# Patient Record
Sex: Male | Born: 1945
Health system: Southern US, Community
[De-identification: ages and names within clinical notes are randomized; demographics above are authoritative.]

## PROBLEM LIST (undated history)

## (undated) DIAGNOSIS — N419 Inflammatory disease of prostate, unspecified: Secondary | ICD-10-CM

## (undated) DIAGNOSIS — IMO0001 Reserved for inherently not codable concepts without codable children: Secondary | ICD-10-CM

## (undated) DIAGNOSIS — H9190 Unspecified hearing loss, unspecified ear: Secondary | ICD-10-CM

## (undated) DIAGNOSIS — I471 Supraventricular tachycardia, unspecified: Secondary | ICD-10-CM

## (undated) DIAGNOSIS — D509 Iron deficiency anemia, unspecified: Secondary | ICD-10-CM

## (undated) DIAGNOSIS — J309 Allergic rhinitis, unspecified: Secondary | ICD-10-CM

## (undated) DIAGNOSIS — Z800 Family history of malignant neoplasm of digestive organs: Secondary | ICD-10-CM

## (undated) DIAGNOSIS — K625 Hemorrhage of anus and rectum: Secondary | ICD-10-CM

## (undated) DIAGNOSIS — Q5310 Unspecified undescended testicle, unilateral: Secondary | ICD-10-CM

## (undated) DIAGNOSIS — L570 Actinic keratosis: Secondary | ICD-10-CM

## (undated) DIAGNOSIS — Z8719 Personal history of other diseases of the digestive system: Secondary | ICD-10-CM

## (undated) DIAGNOSIS — E785 Hyperlipidemia, unspecified: Secondary | ICD-10-CM

## (undated) HISTORY — DX: Supraventricular tachycardia, unspecified: I47.10

## (undated) HISTORY — DX: Actinic keratosis: L57.0

## (undated) HISTORY — PX: INGUINAL HERNIA REPAIR: SHX194

## (undated) HISTORY — DX: Family history of malignant neoplasm of digestive organs: Z80.0

## (undated) HISTORY — DX: Personal history of other diseases of the digestive system: Z87.19

## (undated) HISTORY — DX: Reserved for inherently not codable concepts without codable children: IMO0001

## (undated) HISTORY — DX: Inflammatory disease of prostate, unspecified: N41.9

## (undated) HISTORY — DX: Hemorrhage of anus and rectum: K62.5

## (undated) HISTORY — DX: Allergic rhinitis, unspecified: J30.9

## (undated) HISTORY — PX: PROSTATE BIOPSY: SHX241

## (undated) HISTORY — DX: Iron deficiency anemia, unspecified: D50.9

## (undated) HISTORY — DX: Unspecified undescended testicle, unilateral: Q53.10

## (undated) HISTORY — DX: Unspecified hearing loss, unspecified ear: H91.90

## (undated) HISTORY — DX: Hyperlipidemia, unspecified: E78.5

## (undated) HISTORY — PX: COLONOSCOPY: SHX174

---

## 2006-10-11 ENCOUNTER — Ambulatory Visit: Payer: Self-pay | Admitting: Urology

## 2012-12-08 DIAGNOSIS — C4491 Basal cell carcinoma of skin, unspecified: Secondary | ICD-10-CM

## 2012-12-08 HISTORY — DX: Basal cell carcinoma of skin, unspecified: C44.91

## 2014-03-01 DIAGNOSIS — Z8719 Personal history of other diseases of the digestive system: Secondary | ICD-10-CM

## 2014-03-01 HISTORY — DX: Personal history of other diseases of the digestive system: Z87.19

## 2014-03-09 ENCOUNTER — Other Ambulatory Visit: Payer: Self-pay | Admitting: Family Medicine

## 2014-03-09 ENCOUNTER — Ambulatory Visit (INDEPENDENT_AMBULATORY_CARE_PROVIDER_SITE_OTHER): Payer: BC Managed Care – PPO | Admitting: Family Medicine

## 2014-03-09 ENCOUNTER — Encounter: Payer: Self-pay | Admitting: Family Medicine

## 2014-03-09 ENCOUNTER — Ambulatory Visit (HOSPITAL_BASED_OUTPATIENT_CLINIC_OR_DEPARTMENT_OTHER)
Admission: RE | Admit: 2014-03-09 | Discharge: 2014-03-09 | Disposition: A | Discharge status: Home / Self Care | Payer: Medicare Other | Source: Ambulatory Visit | Attending: Family Medicine | Admitting: Family Medicine

## 2014-03-09 VITALS — BP 133/85 | HR 73 | Temp 97.7°F | Ht 69.0 in | Wt 195.0 lb

## 2014-03-09 DIAGNOSIS — K4090 Unilateral inguinal hernia, without obstruction or gangrene, not specified as recurrent: Secondary | ICD-10-CM

## 2014-03-09 DIAGNOSIS — R634 Abnormal weight loss: Secondary | ICD-10-CM | POA: Insufficient documentation

## 2014-03-09 DIAGNOSIS — G8929 Other chronic pain: Secondary | ICD-10-CM | POA: Insufficient documentation

## 2014-03-09 DIAGNOSIS — R1032 Left lower quadrant pain: Secondary | ICD-10-CM

## 2014-03-09 DIAGNOSIS — R109 Unspecified abdominal pain: Secondary | ICD-10-CM | POA: Diagnosis present

## 2014-03-09 LAB — CBC WITH DIFFERENTIAL/PLATELET
BASOS PCT: 0.2 % (ref 0.0–3.0)
Basophils Absolute: 0 10*3/uL (ref 0.0–0.1)
Eosinophils Absolute: 0.2 10*3/uL (ref 0.0–0.7)
Eosinophils Relative: 1.8 % (ref 0.0–5.0)
HCT: 36.7 % — ABNORMAL LOW (ref 39.0–52.0)
HEMOGLOBIN: 11.9 g/dL — AB (ref 13.0–17.0)
LYMPHS PCT: 9.2 % — AB (ref 12.0–46.0)
Lymphs Abs: 1.1 10*3/uL (ref 0.7–4.0)
MCHC: 32.5 g/dL (ref 30.0–36.0)
MCV: 90.3 fl (ref 78.0–100.0)
MONOS PCT: 8.5 % (ref 3.0–12.0)
Monocytes Absolute: 1 10*3/uL (ref 0.1–1.0)
NEUTROS PCT: 80.3 % — AB (ref 43.0–77.0)
Neutro Abs: 9.6 10*3/uL — ABNORMAL HIGH (ref 1.4–7.7)
Platelets: 286 10*3/uL (ref 150.0–400.0)
RBC: 4.07 Mil/uL — AB (ref 4.22–5.81)
RDW: 14.3 % (ref 11.5–15.5)
WBC: 11.9 10*3/uL — AB (ref 4.0–10.5)

## 2014-03-09 LAB — COMPREHENSIVE METABOLIC PANEL
ALBUMIN: 3.2 g/dL — AB (ref 3.5–5.2)
ALT: 12 U/L (ref 0–53)
AST: 10 U/L (ref 0–37)
Alkaline Phosphatase: 61 U/L (ref 39–117)
BUN: 18 mg/dL (ref 6–23)
CALCIUM: 9.2 mg/dL (ref 8.4–10.5)
CHLORIDE: 104 meq/L (ref 96–112)
CO2: 26 mEq/L (ref 19–32)
Creatinine, Ser: 1.1 mg/dL (ref 0.4–1.5)
GFR: 68.31 mL/min (ref >60.00)
GLUCOSE: 93 mg/dL (ref 70–99)
POTASSIUM: 4 meq/L (ref 3.5–5.1)
Sodium: 136 mEq/L (ref 135–145)
Total Bilirubin: 0.5 mg/dL (ref 0.2–1.2)
Total Protein: 6.5 g/dL (ref 6.0–8.3)

## 2014-03-09 MED ORDER — IOHEXOL 300 MG/ML  SOLN
100.0000 mL | Freq: Once | INTRAMUSCULAR | Status: AC | PRN
  Administered 2014-03-09: 100 mL via INTRAVENOUS

## 2014-03-09 MED ORDER — METRONIDAZOLE 500 MG PO TABS: 500.0000 mg | ORAL_TABLET | Freq: Three times a day (TID) | ORAL | Status: DC

## 2014-03-09 MED ORDER — CIPROFLOXACIN HCL 750 MG PO TABS: 750.0000 mg | ORAL_TABLET | Freq: Two times a day (BID) | ORAL | Status: DC

## 2014-03-09 NOTE — Progress Notes (Signed)
Office Note 03/09/2014  CC:  Chief Complaint  Patient presents with  . Establish Care   HPI:  Matthew [name] is a 68 y.o. White male who is here to establish care Patient's most recent primary MD: none. Old records were not reviewed prior to or during today's visit.  In the last few weeks he is noticing progressively worsening pain in the lower abdomen, suprapubic region.  Says it feels like a gas pain.  Passing gas somewhat relieves it.  No specific trigger. Feels a mild malaise/tired/achy feeling with it.  It was coming and going up until the last 2 days and is now constant.  No nausea.  No abd distention.  No diarrhea.  Feels like he has to have BM more frequently but doesn't have evacuation of stool.  Last BM was soft/semi-formed--several last night.  Noted no blood or pus in stool.  Increased urinary frequency and urgency lately, but no dysuria.  NO LUT obstructive sx's.   Past Medical History  Diagnosis Date  . Allergic rhinitis   . Prostatitis   . Hearing impairment     Noise exposure  . Undescended left testicle     suspected to be atrophic    Past Surgical History  Procedure Laterality Date  . Inguinal hernia repair      1 as infant, one in 59s, 1 in 1980s  . Prostate biopsy  ?2004    benign per pt report  . Colonoscopy  approx age 53 per pt    ? normal per pt    Family History  Problem Relation Age of Onset  . Cancer Father     colon and lung    History   Social History  . Marital Status: Married    Spouse Name: N/A    Number of Children: N/A  . Years of Education: N/A   Occupational History  . Not on file.   Social History Main Topics  . Smoking status: Former Research scientist (life sciences)  . Smokeless tobacco: Former Systems developer    Types: Chew  . Alcohol Use: Yes  . Drug Use: No  . Sexual Activity: Not on file   Other Topics Concern  . Not on file   Social History Narrative   Married, 1 son.   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   No  tobacco, rare alcohol, no drugs.   MEDS: none  No Known Allergies  ROS Review of Systems  PE; Blood pressure 133/85, pulse 73, temperature 97.7 F (36.5 C), temperature source Temporal, height 5\' 9"  (1.753 m), weight 195 lb (88.451 kg), SpO2 98.00%. Gen: Alert, well appearing.  Patient is oriented to person, place, time, and situation. AFFECT: pleasant, lucid thought and speech.  He is hard of hearing. CV: RRR, no m/r/g.   LUNGS: CTA bilat, nonlabored resps, good aeration in all lung fields. ABD: soft, nondistended.  BS normal.  No HSM.  I can feel a small, tender left inguinal mass that is not reduceable, and he is focally tender in left groin/LLQ without guarding or rebound tenderness.  No bruit.  GU: Right testicle normal, penis normal.  Left hemiscrotum empty. EXT: no clubbing, cyanosis, or edema.  Rectal exam: negative without mass, lesions or tenderness, PROSTATE EXAM: smooth and symmetric without nodules or tenderness.  Pertinent labs:  None today  ASSESSMENT AND PLAN:   New pt; will obtain pertinent old records.  Left lower inguinal/abd pain. Suspect incarcerated inguinal hernia.  Asked gen surg to see  in their office for confirmation, as patient is not acutely ill and I don't feel an eval in ED setting is necessary.  They declined to see him, recommended he go to ED.  I decided to get a CT scan of abd/pelv with oral and IV contrast to further evaluate. Check CBC and CMET as well.  An After Visit Summary was printed and given to the patient.  Spent 50 min with pt today, with >50% of this time spent in counseling and care coordination regarding the above problems.   To be determined based on results of pending workup.

## 2014-03-09 NOTE — Progress Notes (Signed)
Pre visit review using our clinic review tool, if applicable. No additional management support is needed unless otherwise documented below in the visit note. 

## 2014-03-29 ENCOUNTER — Ambulatory Visit (INDEPENDENT_AMBULATORY_CARE_PROVIDER_SITE_OTHER): Payer: Medicare Other | Admitting: Family Medicine

## 2014-03-29 ENCOUNTER — Encounter: Payer: Self-pay | Admitting: Family Medicine

## 2014-03-29 VITALS — BP 138/78 | HR 68 | Temp 97.5°F | Ht 69.0 in | Wt 197.0 lb

## 2014-03-29 DIAGNOSIS — R918 Other nonspecific abnormal finding of lung field: Secondary | ICD-10-CM

## 2014-03-29 DIAGNOSIS — Z23 Encounter for immunization: Secondary | ICD-10-CM

## 2014-03-29 DIAGNOSIS — K5732 Diverticulitis of large intestine without perforation or abscess without bleeding: Secondary | ICD-10-CM

## 2014-03-29 NOTE — Progress Notes (Signed)
OFFICE NOTE  03/29/2014  CC:  Chief Complaint  Patient presents with  . Follow-up    2 week   HPI: Patient is a 68 y.o. Caucasian male who is here for 2 wk f/u diverticulitis.   Needs f/u abd/pelv CT as per radiol rec's and also pulm nodules found bilat, needs f/u chest CT in 12 mo. No more bloating or pain but still having 3 slightly loose BM's per day.  Good appetite.  He is completely finished with abx for the last 5d.   Pertinent PMH:  Past medical, surgical, social, and family history reviewed and no changes are noted since last office visit.  MEDS:  None currently  PE: Blood pressure 138/78, pulse 68, temperature 97.5 F (36.4 C), temperature source Temporal, height 5\' 9"  (1.753 m), weight 197 lb (89.359 kg), SpO2 97.00%. Gen: Alert, well appearing.  Patient is oriented to person, place, time, and situation. No further exam today.  LAB:  Lab Results  Component Value Date   WBC 11.9* 03/09/2014   HGB 11.9* 03/09/2014   HCT 36.7* 03/09/2014   MCV 90.3 03/09/2014   PLT 286.0 03/09/2014     Chemistry      Component Value Date/Time   NA 136 03/09/2014 1436   K 4.0 03/09/2014 1436   CL 104 03/09/2014 1436   CO2 26 03/09/2014 1436   BUN 18 03/09/2014 1436   CREATININE 1.1 03/09/2014 1436      Component Value Date/Time   CALCIUM 9.2 03/09/2014 1436   ALKPHOS 61 03/09/2014 1436   AST 10 03/09/2014 1436   ALT 12 03/09/2014 1436   BILITOT 0.5 03/09/2014 1436       IMPRESSION AND PLAN:  1) Acute diverticulitis: resolved. Will arrange repeat CT abd/pelv as per radiologist's recommendations.  2) Pulm nodules, low risk for bronchogenic carcinoma.  Repeat CT chest 1 yr.  An After Visit Summary was printed and given to the patient.  FOLLOW UP: prn

## 2014-03-29 NOTE — Patient Instructions (Signed)

## 2014-03-29 NOTE — Progress Notes (Signed)
Pre visit review using our clinic review tool, if applicable. No additional management support is needed unless otherwise documented below in the visit note. 

## 2014-04-02 ENCOUNTER — Ambulatory Visit (HOSPITAL_BASED_OUTPATIENT_CLINIC_OR_DEPARTMENT_OTHER)
Admission: RE | Admit: 2014-04-02 | Discharge: 2014-04-02 | Disposition: A | Discharge status: Home / Self Care | Payer: Medicare Other | Source: Ambulatory Visit | Attending: Family Medicine | Admitting: Family Medicine

## 2014-04-02 ENCOUNTER — Encounter (HOSPITAL_BASED_OUTPATIENT_CLINIC_OR_DEPARTMENT_OTHER): Payer: Self-pay

## 2014-04-02 DIAGNOSIS — K5732 Diverticulitis of large intestine without perforation or abscess without bleeding: Secondary | ICD-10-CM | POA: Insufficient documentation

## 2014-04-02 MED ORDER — IOHEXOL 300 MG/ML  SOLN
100.0000 mL | Freq: Once | INTRAMUSCULAR | Status: AC | PRN
  Administered 2014-04-02: 100 mL via INTRAVENOUS

## 2014-05-09 ENCOUNTER — Ambulatory Visit: Payer: Self-pay | Admitting: Internal Medicine

## 2014-09-20 ENCOUNTER — Encounter: Payer: Self-pay | Admitting: Family Medicine

## 2015-03-26 ENCOUNTER — Ambulatory Visit (INDEPENDENT_AMBULATORY_CARE_PROVIDER_SITE_OTHER): Payer: Medicare Other | Admitting: Family Medicine

## 2015-03-26 ENCOUNTER — Encounter: Payer: Self-pay | Admitting: Family Medicine

## 2015-03-26 VITALS — BP 125/82 | HR 59 | Temp 98.0°F | Resp 16 | Ht 69.0 in | Wt 196.0 lb

## 2015-03-26 DIAGNOSIS — Z23 Encounter for immunization: Secondary | ICD-10-CM | POA: Diagnosis not present

## 2015-03-26 DIAGNOSIS — K5792 Diverticulitis of intestine, part unspecified, without perforation or abscess without bleeding: Secondary | ICD-10-CM

## 2015-03-26 MED ORDER — CIPROFLOXACIN HCL 750 MG PO TABS: 750.0000 mg | ORAL_TABLET | Freq: Two times a day (BID) | ORAL | Status: DC

## 2015-03-26 MED ORDER — METRONIDAZOLE 500 MG PO TABS: 500.0000 mg | ORAL_TABLET | Freq: Three times a day (TID) | ORAL | Status: DC

## 2015-03-26 NOTE — Progress Notes (Signed)
Pre visit review using our clinic review tool, if applicable. No additional management support is needed unless otherwise documented below in the visit note. 

## 2015-03-26 NOTE — Addendum Note (Signed)
Addended by: Lanae Crumbly on: 03/26/2015 08:57 AM   Modules accepted: Orders

## 2015-03-26 NOTE — Progress Notes (Signed)
OFFICE VISIT  03/26/2015   CC:  Chief Complaint  Patient presents with  . Abdominal Pain    LLQ x 3 days, has history of diverticulitis   HPI:    Patient is a 69 y.o. Caucasian male who presents for abd pain. Onset 3 days ago of severe pain in LLQ/extending into L groin some, constant with waxing/waning intensity.  No n/v.  Possible subjective fever first couple days.  No diarrhea, no mucous in stool, no melena.   This is consistent with past episode of diverticulitis.     Past Medical History  Diagnosis Date  . Allergic rhinitis   . Prostatitis   . Hearing impairment     Noise exposure  . Undescended left testicle     suspected to be atrophic  . History of diverticulitis of colon 03/2014    F/u CT showed resolution.    Past Surgical History  Procedure Laterality Date  . Inguinal hernia repair      1 as infant, one in 63s, 1 in 1980s  . Prostate biopsy  ?2004    benign per pt report  . Colonoscopy  approx age 46 per pt    ? normal per pt    MEDS: none  No Known Allergies  ROS As per HPI  PE: Blood pressure 125/82, pulse 59, temperature 98 F (36.7 C), temperature source Oral, resp. rate 16, height 5\' 9"  (1.753 m), weight 196 lb (88.905 kg), SpO2 98 %. Gen: Alert, well appearing.  Patient is oriented to person, place, time, and situation. AVW:UJWJ: no injection, icteris, swelling, or exudate.  EOMI, PERRLA. Mouth: lips without lesion/swelling.  Oral mucosa pink and moist. Oropharynx without erythema, exudate, or swelling.  Neck - No masses or thyromegaly or limitation in range of motion CV: RRR, no m/r/g.   LUNGS: CTA bilat, nonlabored resps, good aeration in all lung fields. ABD: soft, ND.  TTP in LLQ w/out guarding or rebound.  No mass, HSM, or bruit. EXT: no c/c and only trace bilat LE pitting edema  LABS:    Chemistry      Component Value Date/Time   NA 136 03/09/2014 1436   K 4.0 03/09/2014 1436   CL 104 03/09/2014 1436   CO2 26 03/09/2014 1436    BUN 18 03/09/2014 1436   CREATININE 1.1 03/09/2014 1436      Component Value Date/Time   CALCIUM 9.2 03/09/2014 1436   ALKPHOS 61 03/09/2014 1436   AST 10 03/09/2014 1436   ALT 12 03/09/2014 1436   BILITOT 0.5 03/09/2014 1436       IMPRESSION AND PLAN:  1) Acute diverticulitis: ok for outpt mgmt, no imaging indicated at this time. Cipro 750mg  bid x 10d and metronidazole 500mg  tid x 10d rx'd today. Pt declined my offer of pain meds for short term relief.  Flu vaccine IM today.  An After Visit Summary was printed and given to the patient.   FOLLOW UP: Return if symptoms worsen or fail to improve.

## 2015-04-05 ENCOUNTER — Telehealth: Payer: Self-pay | Admitting: *Deleted

## 2015-04-05 NOTE — Telephone Encounter (Signed)
Pt advised and voiced understanding.   

## 2015-04-05 NOTE — Telephone Encounter (Signed)
Pt LMOM on 04/05/15 at 3:13pm he stated that he just finished the antibiotic for diverticulitis. He stated that this is his 2nd episode of diverticulitis and he wanted to know if Dr. Anitra Lauth could recommend to him something to help prevent the recurrents of this. He stated that he didn't know if there was a diet plan he could follow or something else. Please advise. Thanks.

## 2015-04-05 NOTE — Telephone Encounter (Signed)
The only thing preventative he can do is to try to avoid eating seeds and nuts as much as possible.-thx

## 2015-07-04 DIAGNOSIS — B351 Tinea unguium: Secondary | ICD-10-CM | POA: Diagnosis not present

## 2015-07-04 DIAGNOSIS — Z1283 Encounter for screening for malignant neoplasm of skin: Secondary | ICD-10-CM | POA: Diagnosis not present

## 2015-07-04 DIAGNOSIS — L578 Other skin changes due to chronic exposure to nonionizing radiation: Secondary | ICD-10-CM | POA: Diagnosis not present

## 2015-07-04 DIAGNOSIS — L570 Actinic keratosis: Secondary | ICD-10-CM | POA: Diagnosis not present

## 2015-07-04 DIAGNOSIS — D229 Melanocytic nevi, unspecified: Secondary | ICD-10-CM | POA: Diagnosis not present

## 2015-07-04 DIAGNOSIS — L820 Inflamed seborrheic keratosis: Secondary | ICD-10-CM | POA: Diagnosis not present

## 2015-07-04 DIAGNOSIS — L812 Freckles: Secondary | ICD-10-CM | POA: Diagnosis not present

## 2015-07-04 DIAGNOSIS — Z85828 Personal history of other malignant neoplasm of skin: Secondary | ICD-10-CM | POA: Diagnosis not present

## 2015-07-04 DIAGNOSIS — L821 Other seborrheic keratosis: Secondary | ICD-10-CM | POA: Diagnosis not present

## 2015-07-22 IMAGING — CT CT ABD-PELV W/ CM
2 of 5 series · 15 of 46 positions shown, 17 images · IV contrast (omnipaque)
Comparison: None.

CLINICAL DATA: Chronic abdomen pain for several weeks and weight
loss.

EXAM:
CT ABDOMEN AND PELVIS WITH CONTRAST
TECHNIQUE: Multidetector CT imaging of the abdomen and pelvis was performed
using the standard protocol following bolus administration of
intravenous contrast.
CONTRAST:  100mL OMNIPAQUE IOHEXOL 300 MG/ML  SOLN

[Series 2: abd/pelvis 5.0 b31f · axial · 0.82mm/px · z∈[-522,-112]mm · 12 of 94 slices shown, 14 images]
[im 6/94  soft-tissue]
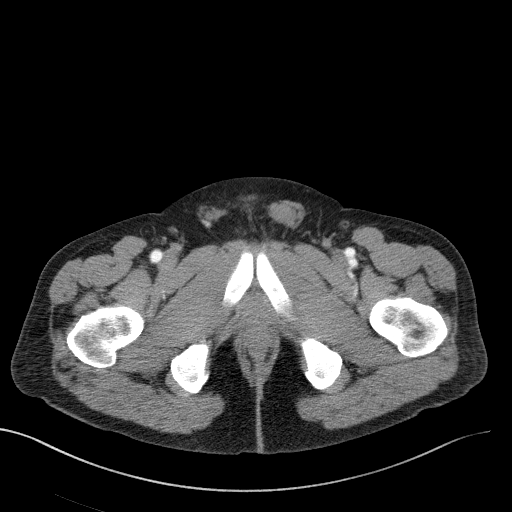
[im 6/94  bone]
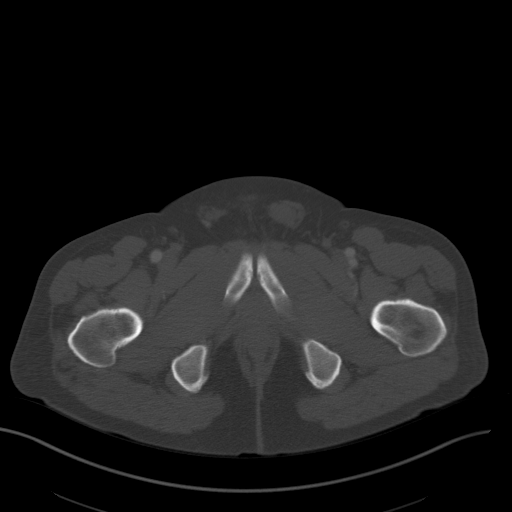
[im 16/94  soft-tissue]
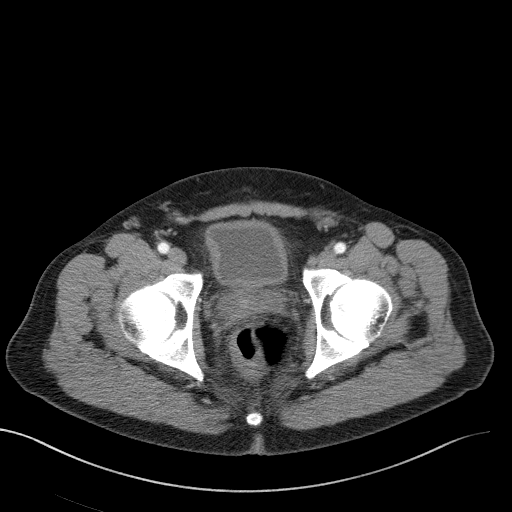
[im 21/94  soft-tissue]
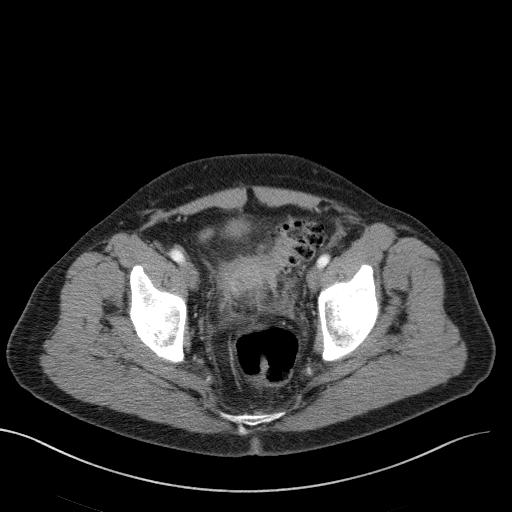
[im 26/94  soft-tissue]
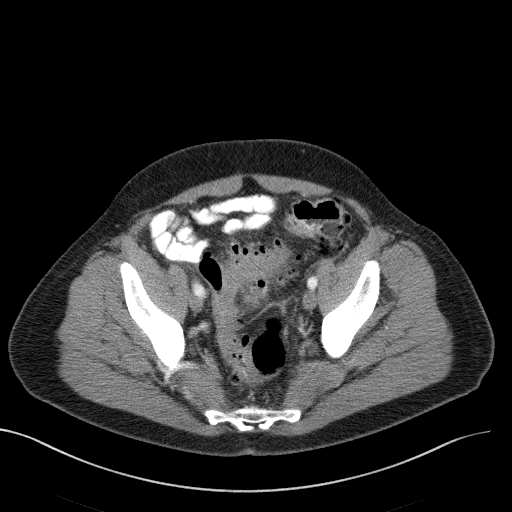
[im 37/94  soft-tissue]
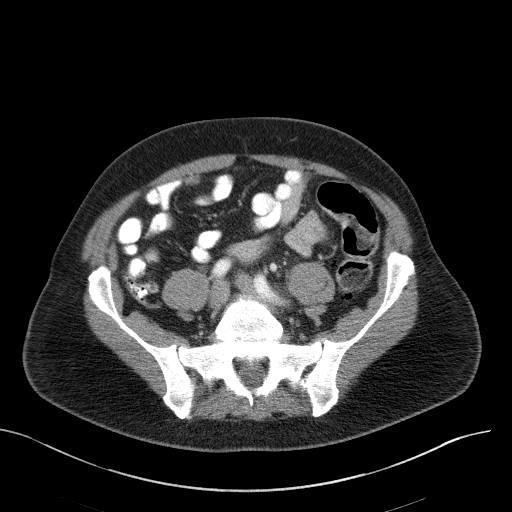
[im 42/94  soft-tissue]
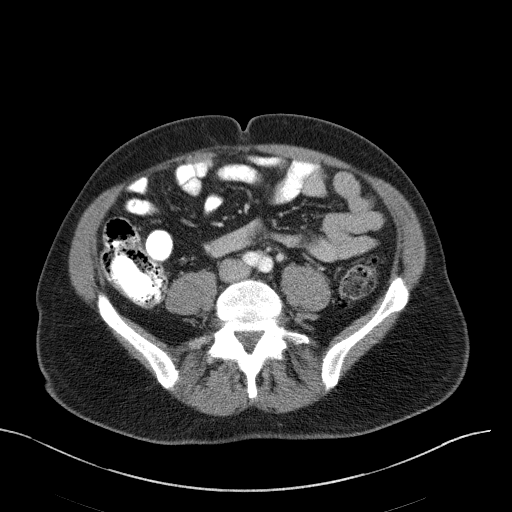
[im 52/94  soft-tissue]
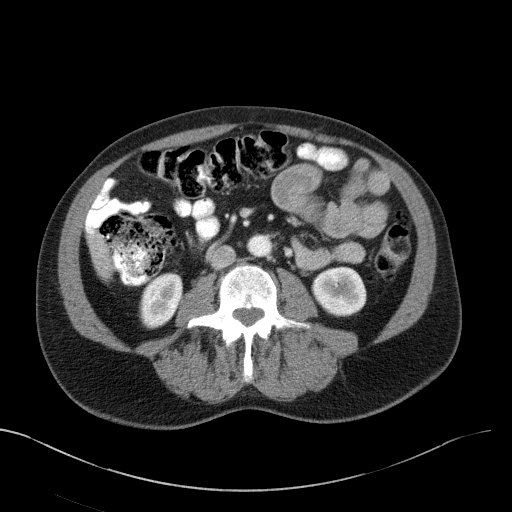
[im 57/94  soft-tissue]
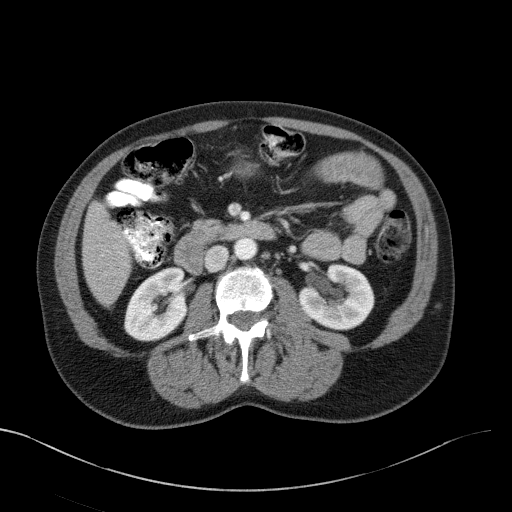
[im 68/94  soft-tissue]
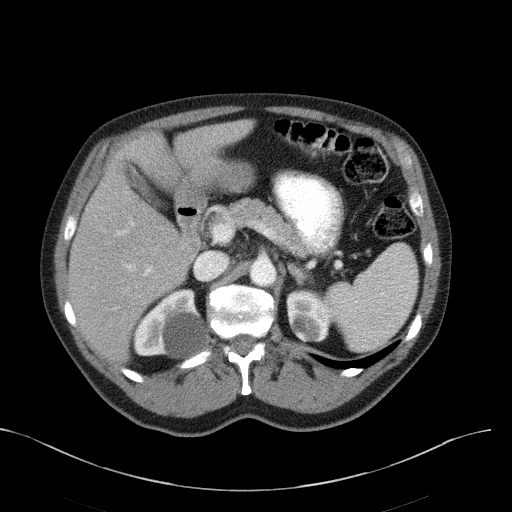
[im 68/94  bone]
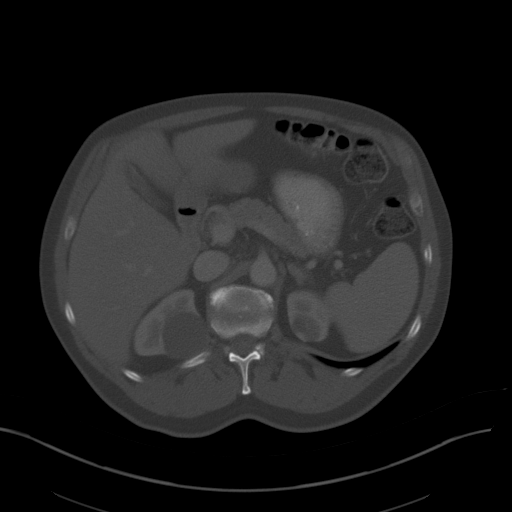
[im 73/94  soft-tissue]
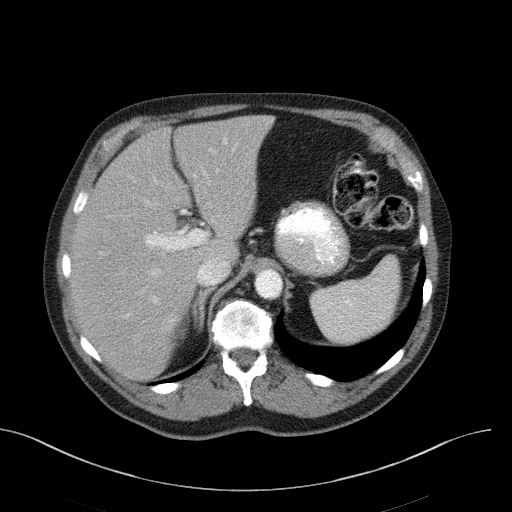
[im 78/94  soft-tissue]
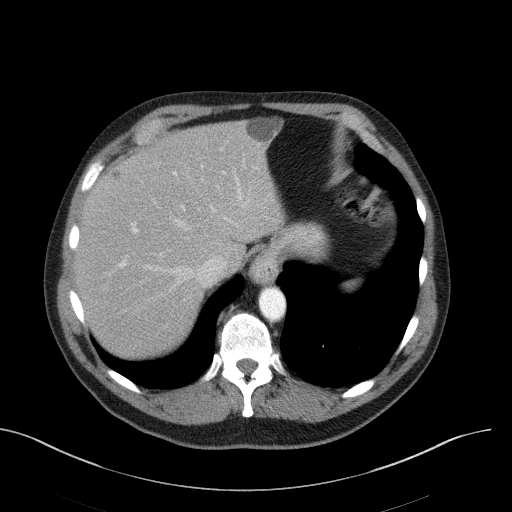
[im 88/94  soft-tissue]
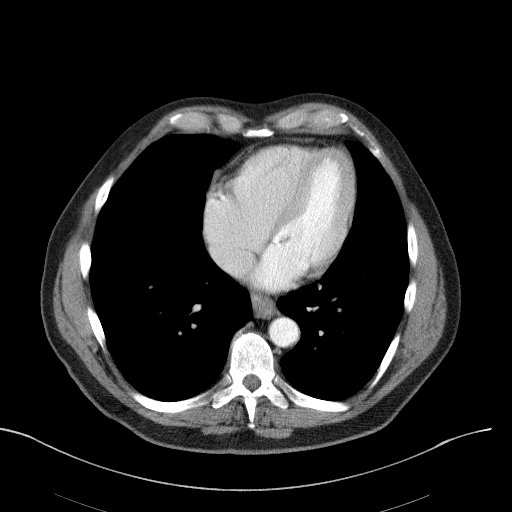

[Series 5: abd/pelvis 3.0 coronal · coronal · 0.85mm/px · 3 of 94 slices shown]
[im 32/94  soft-tissue]
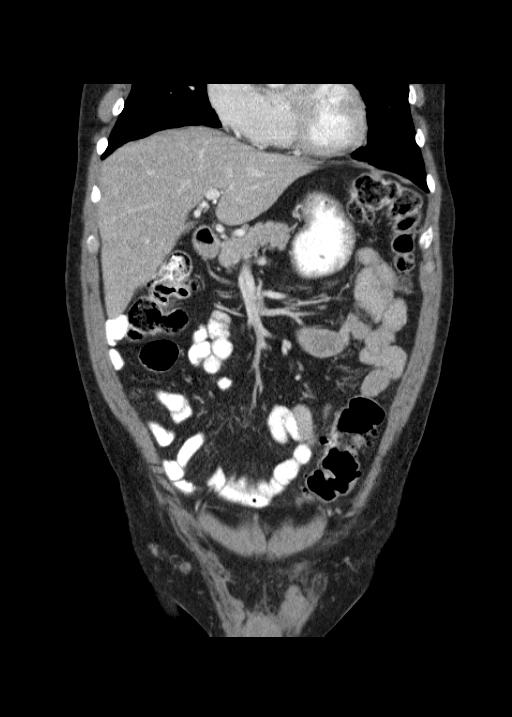
[im 42/94  soft-tissue]
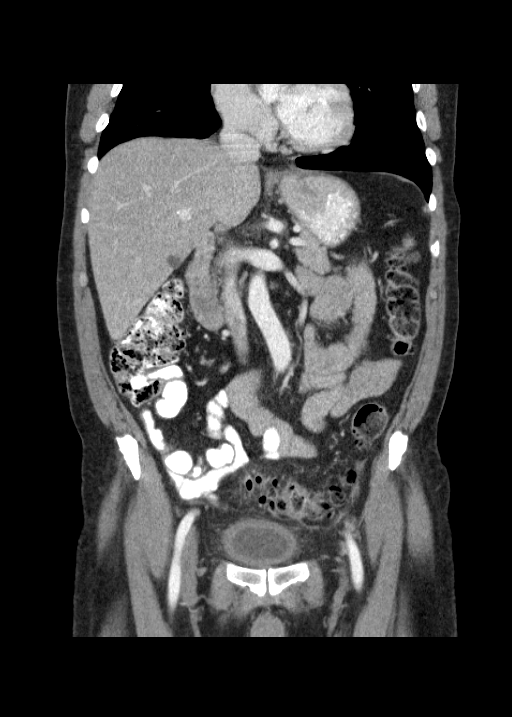
[im 52/94  soft-tissue]
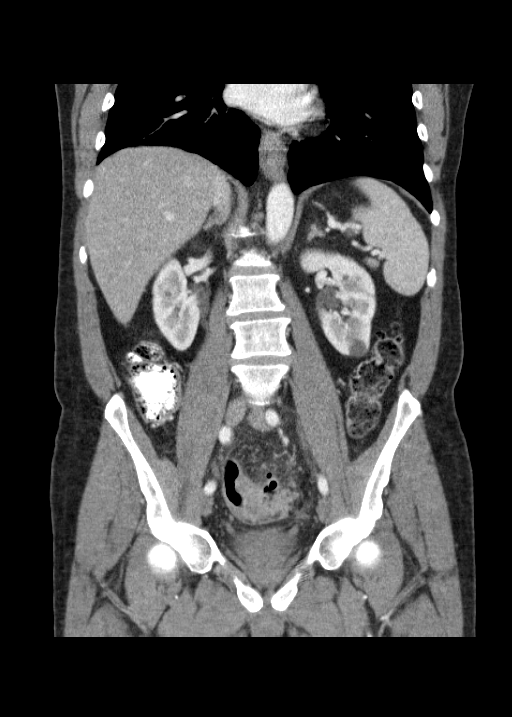

[15 of 46 positions shown; findings below may reference images not displayed]

FINDINGS: There are several cysts within the liver, largest in the left lobe
measuring 2.6 x 1.8 cm. The liver is otherwise normal. The spleen,
pancreas, gallbladder, adrenal glands are normal. There are several
simple cysts within bilateral kidneys, largest in the upper pole
right kidney measuring 3.5 x 4 cm. There is no hydronephrosis
bilaterally. The aorta is normal without aneurysmal dilatation.
There is no abdominal lymphadenopathy. There is a small hiatal
hernia. There is no small bowel obstruction. The appendix is normal.
There is stranding and inflammation surrounding a diffuse thick
walled sigmoid colon. There is diverticulosis of colon.

Partial fluid-filled bladder is normal. Prostate calcifications are
identified. There is a 6 mm nodule in the lateral basal segment of
the left lower lobe. There is a 3 mm nodule in the lateral right
lower lobe. There is no focal pneumonia or pleural effusion.
Degenerative joint changes of the spine are noted.
IMPRESSION: Findings in the sigmoid colon most likely secondary to acute
diverticulitis. Followup CT is recommended after treatment to ensure
complete resolution and to ensure no underlying mass is present.

Bilateral pulmonary nodules. If the patient is at high risk for
bronchogenic carcinoma, follow-up chest CT at 6-12 months is
recommended. If the patient is at low risk for bronchogenic
carcinoma, follow-up chest CT at 12 months is recommended. This
recommendation follows the consensus statement: Guidelines for
Management of Small Pulmonary Nodules Detected on CT Scans: A
Statement from the [HOSPITAL] as published in Radiology

## 2015-10-17 ENCOUNTER — Other Ambulatory Visit: Payer: Self-pay | Admitting: Pediatrics

## 2015-10-17 DIAGNOSIS — R1310 Dysphagia, unspecified: Secondary | ICD-10-CM

## 2015-10-21 ENCOUNTER — Ambulatory Visit
Admission: RE | Admit: 2015-10-21 | Discharge: 2015-10-21 | Disposition: A | Discharge status: Home / Self Care | Payer: Medicare Other | Source: Ambulatory Visit | Attending: Pediatrics | Admitting: Pediatrics

## 2015-10-21 DIAGNOSIS — K449 Diaphragmatic hernia without obstruction or gangrene: Secondary | ICD-10-CM | POA: Insufficient documentation

## 2015-10-21 DIAGNOSIS — R1310 Dysphagia, unspecified: Secondary | ICD-10-CM | POA: Diagnosis not present

## 2015-10-29 DIAGNOSIS — Z9889 Other specified postprocedural states: Secondary | ICD-10-CM | POA: Diagnosis not present

## 2015-10-29 DIAGNOSIS — H9190 Unspecified hearing loss, unspecified ear: Secondary | ICD-10-CM | POA: Diagnosis not present

## 2015-10-29 DIAGNOSIS — R1310 Dysphagia, unspecified: Secondary | ICD-10-CM | POA: Diagnosis not present

## 2015-10-29 DIAGNOSIS — K449 Diaphragmatic hernia without obstruction or gangrene: Secondary | ICD-10-CM | POA: Diagnosis not present

## 2015-10-29 DIAGNOSIS — K2210 Ulcer of esophagus without bleeding: Secondary | ICD-10-CM | POA: Diagnosis not present

## 2015-10-29 DIAGNOSIS — K208 Other esophagitis: Secondary | ICD-10-CM | POA: Diagnosis not present

## 2015-10-29 DIAGNOSIS — R933 Abnormal findings on diagnostic imaging of other parts of digestive tract: Secondary | ICD-10-CM | POA: Diagnosis not present

## 2015-10-29 DIAGNOSIS — K222 Esophageal obstruction: Secondary | ICD-10-CM | POA: Diagnosis not present

## 2015-12-13 DIAGNOSIS — H903 Sensorineural hearing loss, bilateral: Secondary | ICD-10-CM | POA: Insufficient documentation

## 2016-03-01 DIAGNOSIS — E782 Mixed hyperlipidemia: Secondary | ICD-10-CM

## 2016-03-01 DIAGNOSIS — D509 Iron deficiency anemia, unspecified: Secondary | ICD-10-CM

## 2016-03-01 HISTORY — DX: Iron deficiency anemia, unspecified: D50.9

## 2016-03-01 HISTORY — DX: Mixed hyperlipidemia: E78.2

## 2016-03-18 ENCOUNTER — Encounter: Payer: Self-pay | Admitting: Family Medicine

## 2016-03-18 ENCOUNTER — Ambulatory Visit (INDEPENDENT_AMBULATORY_CARE_PROVIDER_SITE_OTHER): Payer: Medicare Other | Admitting: Family Medicine

## 2016-03-18 VITALS — BP 143/85 | HR 55 | Temp 98.1°F | Resp 16 | Ht 69.0 in | Wt 205.8 lb

## 2016-03-18 DIAGNOSIS — Z23 Encounter for immunization: Secondary | ICD-10-CM

## 2016-03-18 DIAGNOSIS — Z0000 Encounter for general adult medical examination without abnormal findings: Secondary | ICD-10-CM | POA: Diagnosis not present

## 2016-03-18 DIAGNOSIS — R7989 Other specified abnormal findings of blood chemistry: Secondary | ICD-10-CM

## 2016-03-18 DIAGNOSIS — Z125 Encounter for screening for malignant neoplasm of prostate: Secondary | ICD-10-CM | POA: Diagnosis not present

## 2016-03-18 DIAGNOSIS — D649 Anemia, unspecified: Secondary | ICD-10-CM | POA: Diagnosis not present

## 2016-03-18 LAB — COMPREHENSIVE METABOLIC PANEL
ALK PHOS: 64 U/L (ref 39–117)
ALT: 10 U/L (ref 0–53)
AST: 15 U/L (ref 0–37)
Albumin: 4.3 g/dL (ref 3.5–5.2)
BILIRUBIN TOTAL: 0.4 mg/dL (ref 0.2–1.2)
BUN: 27 mg/dL — ABNORMAL HIGH (ref 6–23)
CALCIUM: 9.4 mg/dL (ref 8.4–10.5)
CHLORIDE: 105 meq/L (ref 96–112)
CO2: 29 mEq/L (ref 19–32)
Creatinine, Ser: 1.04 mg/dL (ref 0.40–1.50)
GFR: 74.73 mL/min (ref >60.00)
Glucose, Bld: 99 mg/dL (ref 70–99)
Potassium: 4.5 mEq/L (ref 3.5–5.1)
Sodium: 141 mEq/L (ref 135–145)
Total Protein: 6.6 g/dL (ref 6.0–8.3)

## 2016-03-18 LAB — LIPID PANEL
CHOLESTEROL: 214 mg/dL — AB (ref 0–200)
HDL: 42.6 mg/dL (ref >39.00)
NonHDL: 171.84
Total CHOL/HDL Ratio: 5
Triglycerides: 285 mg/dL — ABNORMAL HIGH (ref 0.0–149.0)
VLDL: 57 mg/dL — AB (ref 0.0–40.0)

## 2016-03-18 LAB — CBC WITH DIFFERENTIAL/PLATELET
BASOS ABS: 0 10*3/uL (ref 0.0–0.1)
Basophils Relative: 0.7 % (ref 0.0–3.0)
EOS PCT: 6 % — AB (ref 0.0–5.0)
Eosinophils Absolute: 0.3 10*3/uL (ref 0.0–0.7)
HCT: 37.5 % — ABNORMAL LOW (ref 39.0–52.0)
Hemoglobin: 12.4 g/dL — ABNORMAL LOW (ref 13.0–17.0)
Lymphocytes Relative: 18.8 % (ref 12.0–46.0)
Lymphs Abs: 1 10*3/uL (ref 0.7–4.0)
MCHC: 33.1 g/dL (ref 30.0–36.0)
MCV: 80.9 fl (ref 78.0–100.0)
MONO ABS: 0.5 10*3/uL (ref 0.1–1.0)
Monocytes Relative: 10 % (ref 3.0–12.0)
NEUTROS ABS: 3.5 10*3/uL (ref 1.4–7.7)
NEUTROS PCT: 64.5 % (ref 43.0–77.0)
Platelets: 257 10*3/uL (ref 150.0–400.0)
RBC: 4.64 Mil/uL (ref 4.22–5.81)
RDW: 18.6 % — ABNORMAL HIGH (ref 11.5–15.5)
WBC: 5.4 10*3/uL (ref 4.0–10.5)

## 2016-03-18 LAB — PSA, MEDICARE: PSA: 1.67 ng/mL (ref 0.10–4.00)

## 2016-03-18 LAB — LDL CHOLESTEROL, DIRECT: Direct LDL: 126 mg/dL

## 2016-03-18 NOTE — Addendum Note (Signed)
Addended by: Gordy Councilman on: 03/18/2016 09:28 AM   Modules accepted: Orders

## 2016-03-18 NOTE — Progress Notes (Signed)
Office Note 03/18/2016  CC:  Chief Complaint  Patient presents with  . Annual Exam    CPE    HPI:  Matthew [name] is a 70 y.o. White male who is here to get CPE. No signif exercise but he is not sedentary: still working. Diet: pt says "healthy".   Past Medical History:  Diagnosis Date  . Allergic rhinitis   . Hearing impairment    Noise exposure  . History of diverticulitis of colon 03/2014   F/u CT showed resolution.  . Prostatitis   . Undescended left testicle    suspected to be atrophic    Past Surgical History:  Procedure Laterality Date  . COLONOSCOPY  approx age 28 per pt   ? normal per pt  . INGUINAL HERNIA REPAIR     1 as infant, one in 1970s, 1 in 41s  . PROSTATE BIOPSY  ?2004   benign per pt report    Family History  Problem Relation Age of Onset  . Cancer Father     colon and lung    Social History   Social History  . Marital status: Married    Spouse name: N/A  . Number of children: N/A  . Years of education: N/A   Occupational History  . Not on file.   Social History Main Topics  . Smoking status: Former Research scientist (life sciences)  . Smokeless tobacco: Former Systems developer    Types: Chew  . Alcohol use Yes  . Drug use: No  . Sexual activity: Not on file   Other Topics Concern  . Not on file   Social History Narrative   Married, 1 son.   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   No tobacco, rare alcohol, no drugs.    Outpatient Medications Prior to Visit  Medication Sig Dispense Refill  . ciprofloxacin (CIPRO) 750 MG tablet Take 1 tablet (750 mg total) by mouth 2 (two) times daily. (Patient not taking: Reported on 03/18/2016) 20 tablet 0  . metroNIDAZOLE (FLAGYL) 500 MG tablet Take 1 tablet (500 mg total) by mouth 3 (three) times daily. (Patient not taking: Reported on 03/18/2016) 30 tablet 0   No facility-administered medications prior to visit.     No Known Allergies  ROS Review of Systems  Constitutional: Negative for  appetite change, chills, fatigue and fever.  HENT: Negative for congestion, dental problem, ear pain and sore throat.   Eyes: Negative for discharge, redness and visual disturbance.  Respiratory: Negative for cough, chest tightness, shortness of breath and wheezing.   Cardiovascular: Negative for chest pain, palpitations and leg swelling.  Gastrointestinal: Negative for abdominal pain, blood in stool, diarrhea, nausea and vomiting.  Genitourinary: Negative for difficulty urinating, dysuria, flank pain, frequency, hematuria and urgency.  Musculoskeletal: Negative for arthralgias, back pain, joint swelling, myalgias and neck stiffness.  Skin: Negative for pallor and rash.  Neurological: Negative for dizziness, speech difficulty, weakness and headaches.  Hematological: Negative for adenopathy. Does not bruise/bleed easily.  Psychiatric/Behavioral: Negative for confusion and sleep disturbance. The patient is not nervous/anxious.     PE; Blood pressure (!) 143/85, pulse (!) 55, temperature 98.1 F (36.7 C), temperature source Oral, resp. rate 16, height 5\' 9"  (1.753 m), weight 205 lb 12.8 oz (93.4 kg), SpO2 99 %. Gen: Alert, well appearing.  Patient is oriented to person, place, time, and situation. AFFECT: pleasant, lucid thought and speech. ENT: Ears: EACs clear, normal epithelium.  TMs with good light reflex and landmarks bilaterally.  Eyes: no injection, icteris, swelling, or exudate.  EOMI, PERRLA. Nose: no drainage or turbinate edema/swelling.  No injection or focal lesion.  Mouth: lips without lesion/swelling.  Oral mucosa pink and moist.  Dentition intact and without obvious caries or gingival swelling.  Oropharynx without erythema, exudate, or swelling.  Neck: supple/nontender.  No LAD, mass, or TM.  Carotid pulses 2+ bilaterally, without bruits. CV: RRR, no m/r/g.   LUNGS: CTA bilat, nonlabored resps, good aeration in all lung fields. ABD: soft, NT, ND, BS normal.  No hepatospenomegaly  or mass.  No bruits. EXT: no clubbing, cyanosis, or edema.  Musculoskeletal: no joint swelling, erythema, warmth, or tenderness.  ROM of all joints intact. Skin - no sores or suspicious lesions or rashes or color changes Rectal exam: negative without mass, lesions or tenderness, PROSTATE EXAM: smooth and symmetric without nodules or tenderness.   Pertinent labs:   Lab Results  Component Value Date   WBC 11.9 (H) 03/09/2014   HGB 11.9 (L) 03/09/2014   HCT 36.7 (L) 03/09/2014   MCV 90.3 03/09/2014   PLT 286.0 03/09/2014   Lab Results  Component Value Date   CREATININE 1.1 03/09/2014   BUN 18 03/09/2014   NA 136 03/09/2014   K 4.0 03/09/2014   CL 104 03/09/2014   CO2 26 03/09/2014   Lab Results  Component Value Date   ALT 12 03/09/2014   AST 10 03/09/2014   ALKPHOS 61 03/09/2014   BILITOT 0.5 03/09/2014    ASSESSMENT AND PLAN:   Health maintenance exam:  Reviewed age and gender appropriate health maintenance issues (prudent diet, regular exercise, health risks of tobacco and excessive alcohol, use of seatbelts, fire alarms in home, use of sunscreen).  Also reviewed age and gender appropriate health screening as well as vaccine recommendations. Flu and prevnar 13 vaccines given today. Screening lipid panel, CMET today.  F/u CBC for hx of normocytic anemia today. Prostate ca screening: PSA drawn today.  DRE normal today. Colon ca screening: pt reports that he got a colonoscopy with [facility] in [facility] in 2016 and says it was normal and no f/u was recommended.  Will get records to confirm.  An After Visit Summary was printed and given to the patient.  FOLLOW UP:  Return in about 1 year (around 03/18/2017) for annual CPE (fasting).  Signed:  Crissie Sickles, MD           03/18/2016

## 2016-03-18 NOTE — Progress Notes (Signed)
Pre visit review using our clinic review tool, if applicable. No additional management support is needed unless otherwise documented below in the visit note. 

## 2016-03-19 ENCOUNTER — Other Ambulatory Visit (INDEPENDENT_AMBULATORY_CARE_PROVIDER_SITE_OTHER): Payer: Medicare Other

## 2016-03-19 ENCOUNTER — Encounter: Payer: Self-pay | Admitting: Family Medicine

## 2016-03-19 DIAGNOSIS — D649 Anemia, unspecified: Secondary | ICD-10-CM | POA: Diagnosis not present

## 2016-03-19 LAB — IRON: Iron: 51 ug/dL (ref 42–165)

## 2016-03-19 LAB — FERRITIN: Ferritin: 6.3 ng/mL — ABNORMAL LOW (ref 22.0–322.0)

## 2016-03-19 LAB — VITAMIN B12: VITAMIN B 12: 602 pg/mL (ref 211–911)

## 2016-03-20 ENCOUNTER — Encounter: Payer: Self-pay | Admitting: Family Medicine

## 2016-03-20 ENCOUNTER — Telehealth: Payer: Self-pay

## 2016-03-20 DIAGNOSIS — D649 Anemia, unspecified: Secondary | ICD-10-CM

## 2016-03-20 NOTE — Telephone Encounter (Signed)
Order placed for take home hemoccult cards.

## 2016-04-19 ENCOUNTER — Encounter: Payer: Self-pay | Admitting: Family Medicine

## 2016-05-01 ENCOUNTER — Other Ambulatory Visit: Payer: Medicare Other

## 2016-05-01 DIAGNOSIS — D649 Anemia, unspecified: Secondary | ICD-10-CM

## 2016-05-04 ENCOUNTER — Encounter: Payer: Self-pay | Admitting: Family Medicine

## 2016-05-04 LAB — HEMOCCULT SLIDES (X 3 CARDS)
Fecal Occult Blood: NEGATIVE
OCCULT 1: NEGATIVE
OCCULT 2: NEGATIVE
OCCULT 3: NEGATIVE
OCCULT 4: NEGATIVE
OCCULT 5: NEGATIVE

## 2016-07-06 DIAGNOSIS — L570 Actinic keratosis: Secondary | ICD-10-CM | POA: Diagnosis not present

## 2016-07-06 DIAGNOSIS — Z1283 Encounter for screening for malignant neoplasm of skin: Secondary | ICD-10-CM | POA: Diagnosis not present

## 2016-07-06 DIAGNOSIS — L700 Acne vulgaris: Secondary | ICD-10-CM | POA: Diagnosis not present

## 2016-07-06 DIAGNOSIS — Z85828 Personal history of other malignant neoplasm of skin: Secondary | ICD-10-CM | POA: Diagnosis not present

## 2017-03-30 ENCOUNTER — Ambulatory Visit (INDEPENDENT_AMBULATORY_CARE_PROVIDER_SITE_OTHER): Payer: Medicare Other | Admitting: Family Medicine

## 2017-03-30 ENCOUNTER — Encounter: Payer: Self-pay | Admitting: Family Medicine

## 2017-03-30 VITALS — BP 126/79 | HR 55 | Temp 97.7°F | Resp 16 | Ht 69.0 in | Wt 205.8 lb

## 2017-03-30 DIAGNOSIS — Z125 Encounter for screening for malignant neoplasm of prostate: Secondary | ICD-10-CM | POA: Diagnosis not present

## 2017-03-30 DIAGNOSIS — E7800 Pure hypercholesterolemia, unspecified: Secondary | ICD-10-CM | POA: Diagnosis not present

## 2017-03-30 DIAGNOSIS — Z23 Encounter for immunization: Secondary | ICD-10-CM | POA: Diagnosis not present

## 2017-03-30 DIAGNOSIS — Z0000 Encounter for general adult medical examination without abnormal findings: Secondary | ICD-10-CM

## 2017-03-30 DIAGNOSIS — Z862 Personal history of diseases of the blood and blood-forming organs and certain disorders involving the immune mechanism: Secondary | ICD-10-CM | POA: Diagnosis not present

## 2017-03-30 DIAGNOSIS — R3129 Other microscopic hematuria: Secondary | ICD-10-CM | POA: Diagnosis not present

## 2017-03-30 LAB — CBC WITH DIFFERENTIAL/PLATELET
BASOS PCT: 0.6 % (ref 0.0–3.0)
Basophils Absolute: 0 10*3/uL (ref 0.0–0.1)
EOS PCT: 7.1 % — AB (ref 0.0–5.0)
Eosinophils Absolute: 0.4 10*3/uL (ref 0.0–0.7)
HCT: 34.6 % — ABNORMAL LOW (ref 39.0–52.0)
HEMOGLOBIN: 11.2 g/dL — AB (ref 13.0–17.0)
LYMPHS PCT: 18.2 % (ref 12.0–46.0)
Lymphs Abs: 1 10*3/uL (ref 0.7–4.0)
MCHC: 32.5 g/dL (ref 30.0–36.0)
MCV: 93.1 fl (ref 78.0–100.0)
MONOS PCT: 10.2 % (ref 3.0–12.0)
Monocytes Absolute: 0.6 10*3/uL (ref 0.1–1.0)
Neutro Abs: 3.6 10*3/uL (ref 1.4–7.7)
Neutrophils Relative %: 63.9 % (ref 43.0–77.0)
Platelets: 303 10*3/uL (ref 150.0–400.0)
RBC: 3.71 Mil/uL — ABNORMAL LOW (ref 4.22–5.81)
RDW: 13.5 % (ref 11.5–15.5)
WBC: 5.7 10*3/uL (ref 4.0–10.5)

## 2017-03-30 LAB — COMPREHENSIVE METABOLIC PANEL
ALT: 12 U/L (ref 0–53)
AST: 16 U/L (ref 0–37)
Albumin: 4.1 g/dL (ref 3.5–5.2)
Alkaline Phosphatase: 51 U/L (ref 39–117)
BILIRUBIN TOTAL: 0.3 mg/dL (ref 0.2–1.2)
BUN: 23 mg/dL (ref 6–23)
CALCIUM: 9.1 mg/dL (ref 8.4–10.5)
CO2: 28 mEq/L (ref 19–32)
CREATININE: 0.98 mg/dL (ref 0.40–1.50)
Chloride: 106 mEq/L (ref 96–112)
GFR: 79.81 mL/min (ref >60.00)
Glucose, Bld: 97 mg/dL (ref 70–99)
Potassium: 4.4 mEq/L (ref 3.5–5.1)
Sodium: 141 mEq/L (ref 135–145)
Total Protein: 6.1 g/dL (ref 6.0–8.3)

## 2017-03-30 LAB — LIPID PANEL
Cholesterol: 187 mg/dL (ref 0–200)
HDL: 43 mg/dL (ref >39.00)
NonHDL: 143.97
Total CHOL/HDL Ratio: 4
Triglycerides: 203 mg/dL — ABNORMAL HIGH (ref 0.0–149.0)
VLDL: 40.6 mg/dL — ABNORMAL HIGH (ref 0.0–40.0)

## 2017-03-30 LAB — LDL CHOLESTEROL, DIRECT: Direct LDL: 117 mg/dL

## 2017-03-30 LAB — URINALYSIS, ROUTINE W REFLEX MICROSCOPIC
BILIRUBIN URINE: NEGATIVE
KETONES UR: NEGATIVE
LEUKOCYTES UA: NEGATIVE
Nitrite: NEGATIVE
Specific Gravity, Urine: >=1.03 — AB (ref 1.000–1.030)
TOTAL PROTEIN, URINE-UPE24: NEGATIVE
Urine Glucose: NEGATIVE
Urobilinogen, UA: 0.2 (ref 0.0–1.0)
pH: 5.5 (ref 5.0–8.0)

## 2017-03-30 LAB — PSA, MEDICARE: PSA: 1.66 ng/mL (ref 0.10–4.00)

## 2017-03-30 LAB — IRON: Iron: 20 ug/dL — ABNORMAL LOW (ref 42–165)

## 2017-03-30 LAB — FERRITIN: Ferritin: 5.7 ng/mL — ABNORMAL LOW (ref 22.0–322.0)

## 2017-03-30 NOTE — Addendum Note (Signed)
Addended by: Onalee Hua on: 03/30/2017 09:45 AM   Modules accepted: Orders

## 2017-03-30 NOTE — Patient Instructions (Signed)

## 2017-03-30 NOTE — Progress Notes (Signed)
Office Note 03/30/2017  CC:  Chief Complaint  Patient presents with  . Annual Exam    Pt is fasting.     HPI:  Matthew [name] is a 71 y.o. male who is here for annual health maintenance exam.  Pt not on iron and says he never has been despite being instructed to do so.  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 Denies gross hematuria or any other urinary complaint.  EYE exam UTD. Dental: UTD.  Past Medical History:  Diagnosis Date  . Allergic rhinitis   . BRBPR (bright red blood per rectum)    Int hemorrhoids  . Family history of colon cancer in father   . Hearing impairment    Noise exposure  . History of diverticulitis of colon 03/2014   F/u CT showed resolution.  . Hyperlipidemia 03/2016   He declined statin 03/2016  . Iron deficiency anemia 03/2016   Hemoccults x 3 neg 05/2016  . Prostatitis   . Undescended left testicle    suspected to be atrophic    Past Surgical History:  Procedure Laterality Date  . COLONOSCOPY  age 51, then again 09/2014   Recall 09/2017 per [facility] records  . INGUINAL HERNIA REPAIR     1 as infant, one in 1970s, 1 in 12s  . PROSTATE BIOPSY  ?2004   benign per pt report    Family History  Problem Relation Age of Onset  . Cancer Father        colon and lung    Social History   Social History  . Marital status: Married    Spouse name: N/A  . Number of children: N/A  . Years of education: N/A   Occupational History  . Not on file.   Social History Main Topics  . Smoking status: Former Research scientist (life sciences)  . Smokeless tobacco: Former Systems developer    Types: Chew  . Alcohol use Yes  . Drug use: No  . Sexual activity: Not on file   Other Topics Concern  . Not on file   Social History Narrative   Married, 1 son.   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   No tobacco, rare alcohol, no drugs.    Outpatient Medications Prior to Visit  Medication Sig Dispense Refill  . aspirin  EC 81 MG tablet Take 81 mg by mouth.     No facility-administered medications prior to visit.     No Known Allergies  ROS Review of Systems  Constitutional: Negative for appetite change, chills, fatigue and fever.  HENT: Negative for congestion, dental problem, ear pain and sore throat.   Eyes: Negative for discharge, redness and visual disturbance.  Respiratory: Negative for cough, chest tightness, shortness of breath and wheezing.   Cardiovascular: Negative for chest pain, palpitations and leg swelling.  Gastrointestinal: Negative for abdominal pain, blood in stool, diarrhea, nausea and vomiting.  Genitourinary: Negative for difficulty urinating, dysuria, flank pain, frequency, hematuria and urgency.  Musculoskeletal: Negative for arthralgias, back pain, joint swelling, myalgias and neck stiffness.  Skin: Negative for pallor and rash.  Neurological: Negative for dizziness, speech difficulty, weakness and headaches.  Hematological: Negative for adenopathy. Does not bruise/bleed easily.  Psychiatric/Behavioral: Negative for confusion and sleep disturbance. The patient is not nervous/anxious.     PE; Blood pressure 126/79, pulse (!) 55, temperature 97.7 F (36.5 C), temperature source Oral, resp. rate 16, height 5\' 9"  (1.753 m), weight 205  lb 12 oz (93.3 kg), SpO2 100 %. Gen: Alert, well appearing.  Patient is oriented to person, place, time, and situation. AFFECT: pleasant, lucid thought and speech. ENT: Ears: EACs clear, normal epithelium.  TMs with good light reflex and landmarks bilaterally.  Eyes: no injection, icteris, swelling, or exudate.  EOMI, PERRLA. Nose: no drainage or turbinate edema/swelling.  No injection or focal lesion.  Mouth: lips without lesion/swelling.  Oral mucosa pink and moist.  Dentition intact and without obvious caries or gingival swelling.  Oropharynx without erythema, exudate, or swelling.  Neck: supple/nontender.  No LAD, mass, or TM.  Carotid pulses 2+  bilaterally, without bruits. CV: RRR, no m/r/g.   LUNGS: CTA bilat, nonlabored resps, good aeration in all lung fields. ABD: soft, NT, ND, BS normal.  No hepatospenomegaly or mass.  No bruits. EXT: no clubbing, cyanosis, or edema.  Musculoskeletal: no joint swelling, erythema, warmth, or tenderness.  ROM of all joints intact. Skin - no sores or suspicious lesions or rashes or color changes Rectal: deferred by pt  Pertinent labs:  No results found for: TSH Lab Results  Component Value Date   WBC 5.4 03/18/2016   HGB 12.4 (L) 03/18/2016   HCT 37.5 (L) 03/18/2016   MCV 80.9 03/18/2016   PLT 257.0 03/18/2016   Lab Results  Component Value Date   IRON 51 03/19/2016   FERRITIN 6.3 (L) 03/19/2016    Lab Results  Component Value Date   CREATININE 1.04 03/18/2016   BUN 27 (H) 03/18/2016   NA 141 03/18/2016   K 4.5 03/18/2016   CL 105 03/18/2016   CO2 29 03/18/2016   Lab Results  Component Value Date   ALT 10 03/18/2016   AST 15 03/18/2016   ALKPHOS 64 03/18/2016   BILITOT 0.4 03/18/2016   Lab Results  Component Value Date   CHOL 214 (H) 03/18/2016   Lab Results  Component Value Date   HDL 42.60 03/18/2016   No results found for: [facility] Lab Results  Component Value Date   TRIG 285.0 (H) 03/18/2016   Lab Results  Component Value Date   CHOLHDL 5 03/18/2016   Lab Results  Component Value Date   PSA 1.67 03/18/2016    ASSESSMENT AND PLAN:   1) Hx of iron def anemia: hemoccults neg 03/2016.  He did not take iron as instructed. Recheck CBC with iron panel today.  2) Microhematuria: on CDL exam earlier this year. Follow this up now with [facility] with reflex microscopy.  3) Health maintenance exam: Reviewed age and gender appropriate health maintenance issues (prudent diet, regular exercise, health risks of tobacco and excessive alcohol, use of seatbelts, fire alarms in home, use of sunscreen).  Also reviewed age and gender appropriate health screening as well as  vaccine recommendations. Vaccines: Due for pneumovax 23--- given today.  Due for Tdap--rx given today.   Flu vaccine---given today.   Discussed shingrix troday. Labs: CMET, FLP (hyperchol), CBC (hx of iron def anemia 03/2016). Prostate ca screening:  Wants to skip DRE, but does want PSA. Colon ca screening: next colonoscopy due 09/2017.  An After Visit Summary was printed and given to the patient.  FOLLOW UP:  Return in about 1 year (around 03/30/2018) for annual CPE (fasting).  Signed:  Crissie Sickles, MD           03/30/2017

## 2017-03-31 ENCOUNTER — Encounter: Payer: Self-pay | Admitting: *Deleted

## 2017-03-31 LAB — IRON AND TIBC
IRON: 30 ug/dL — AB (ref 38–169)
Iron Saturation: 6 % — CL (ref 15–55)
Total Iron Binding Capacity: 466 ug/dL — ABNORMAL HIGH (ref 250–450)
UIBC: 436 ug/dL — ABNORMAL HIGH (ref 111–343)

## 2017-04-01 HISTORY — PX: ESOPHAGOGASTRODUODENOSCOPY: SHX1529

## 2017-04-27 ENCOUNTER — Other Ambulatory Visit: Payer: Medicare Other

## 2017-04-27 ENCOUNTER — Encounter: Payer: Self-pay | Admitting: Family Medicine

## 2017-04-27 ENCOUNTER — Telehealth: Payer: Self-pay | Admitting: Family Medicine

## 2017-04-27 DIAGNOSIS — D509 Iron deficiency anemia, unspecified: Secondary | ICD-10-CM

## 2017-04-27 LAB — HEMOCCULT SLIDES (X 3 CARDS)
Fecal Occult Blood: NEGATIVE
OCCULT 1: POSITIVE — AB
OCCULT 2: POSITIVE — AB
OCCULT 3: POSITIVE — AB
OCCULT 4: NEGATIVE
OCCULT 5: NEGATIVE

## 2017-04-27 NOTE — Telephone Encounter (Signed)
Copied from [place]. Topic: General - Other >> Apr 27, 2017  8:49 AM Yvette Rack wrote: Reason for CRM: Carmelia Bake from [facility] her number is [phone] would like for Dr Anitra Lauth to add a Hemoccult to pt labs

## 2017-04-27 NOTE — Telephone Encounter (Unsigned)
Copied from [place]. Topic: General - Other >> Apr 27, 2017  8:49 AM Yvette Rack wrote: Reason for CRM: Carmelia Bake from [facility] her number is [phone] would like for Dr Anitra Lauth to add a Hemoccult to pt labs

## 2017-04-27 NOTE — Telephone Encounter (Signed)
Lab order added.  

## 2017-04-28 ENCOUNTER — Other Ambulatory Visit: Payer: Self-pay | Admitting: Family Medicine

## 2017-04-28 DIAGNOSIS — K922 Gastrointestinal hemorrhage, unspecified: Secondary | ICD-10-CM

## 2017-04-28 DIAGNOSIS — D500 Iron deficiency anemia secondary to blood loss (chronic): Secondary | ICD-10-CM

## 2017-05-03 ENCOUNTER — Encounter: Payer: Self-pay | Admitting: Family Medicine

## 2017-05-03 ENCOUNTER — Telehealth: Payer: Self-pay | Admitting: Family Medicine

## 2017-05-03 DIAGNOSIS — K449 Diaphragmatic hernia without obstruction or gangrene: Secondary | ICD-10-CM | POA: Diagnosis not present

## 2017-05-03 DIAGNOSIS — D509 Iron deficiency anemia, unspecified: Secondary | ICD-10-CM | POA: Diagnosis not present

## 2017-05-03 DIAGNOSIS — K3189 Other diseases of stomach and duodenum: Secondary | ICD-10-CM | POA: Diagnosis not present

## 2017-05-03 DIAGNOSIS — Z974 Presence of external hearing-aid: Secondary | ICD-10-CM | POA: Diagnosis not present

## 2017-05-03 DIAGNOSIS — D649 Anemia, unspecified: Secondary | ICD-10-CM | POA: Diagnosis not present

## 2017-05-03 DIAGNOSIS — H9190 Unspecified hearing loss, unspecified ear: Secondary | ICD-10-CM | POA: Diagnosis not present

## 2017-05-03 DIAGNOSIS — K210 Gastro-esophageal reflux disease with esophagitis: Secondary | ICD-10-CM | POA: Diagnosis not present

## 2017-05-03 NOTE — Telephone Encounter (Signed)
Copied from [place]. Topic: General - Other >> May 03, 2017  8:53 AM Oneta Rack wrote: Caller name: Heather  Relation to pt:  secretary from Dr. Stephanie Acre office (Gastroenterology)  Call back number: [phone]   Reason for call:  Dr. Stephanie Acre would like to speak with Dr. Anitra Lauth regarding patient, Dr. Stephanie Acre mobile # [phone] . Secretary did state if unable to reach Dr. on mobile call the office [phone] and they will overhead page. Please advise

## 2017-05-03 NOTE — Telephone Encounter (Signed)
Pls notify pt that I spoke with Dr. Stephanie Acre today (his Gastroenterologist), and I want to see pt again in 6 weeks to recheck how he's doing on his iron pill and his acid reflux pill, plus I'll repeat iron testing at that time to see if he's responding to the iron.-thx

## 2017-05-03 NOTE — Telephone Encounter (Signed)
Pt advised and voiced understanding.  Apt made for 06/07/17 at 8:45am

## 2017-05-10 ENCOUNTER — Encounter: Payer: Self-pay | Admitting: Family Medicine

## 2017-06-07 ENCOUNTER — Ambulatory Visit: Payer: Medicare Other | Admitting: Family Medicine

## 2017-06-07 ENCOUNTER — Encounter: Payer: Self-pay | Admitting: Family Medicine

## 2017-06-07 VITALS — BP 145/87 | HR 61 | Temp 97.8°F | Resp 16 | Wt 208.0 lb

## 2017-06-07 DIAGNOSIS — D509 Iron deficiency anemia, unspecified: Secondary | ICD-10-CM

## 2017-06-07 DIAGNOSIS — K209 Esophagitis, unspecified without bleeding: Secondary | ICD-10-CM

## 2017-06-07 LAB — IRON: IRON: 58 ug/dL (ref 42–165)

## 2017-06-07 LAB — CBC
HCT: 41.3 % (ref 39.0–52.0)
HEMOGLOBIN: 13.5 g/dL (ref 13.0–17.0)
MCHC: 32.6 g/dL (ref 30.0–36.0)
MCV: 93.5 fl (ref 78.0–100.0)
PLATELETS: 252 10*3/uL (ref 150.0–400.0)
RBC: 4.42 Mil/uL (ref 4.22–5.81)
RDW: 17.3 % — AB (ref 11.5–15.5)
WBC: 5.3 10*3/uL (ref 4.0–10.5)

## 2017-06-07 LAB — FERRITIN: Ferritin: 17.6 ng/mL — ABNORMAL LOW (ref 22.0–322.0)

## 2017-06-07 NOTE — Progress Notes (Signed)
OFFICE VISIT  06/07/2017   CC:  Chief Complaint  Patient presents with  . Follow-up    iron deficiency     HPI:    Patient is a 72 y.o.  male who presents for 3 mo f/u iron deficiency anemia. Was started on iron replacement 3 mo ago and also had endoscopy that showed significant esophagitis that was likely the cause of his chronic slow GI blood loss. PPI bid x 1 mo recommended, then lifetime PPI qd. He has complied with this and is now on PPI qd and taking iron tab bid.  Says he feels a lot better, stamina MUCH improved. No melena or hematochezia.   Denies GERD or heartburn or CP. Avoiding GER-inducing foods.  Past Medical History:  Diagnosis Date  . Allergic rhinitis   . BRBPR (bright red blood per rectum)    Int hemorrhoids  . Family history of colon cancer in father   . Hearing impairment    Noise exposure  . History of diverticulitis of colon 03/2014   F/u CT showed resolution.  . Hyperlipidemia 03/2016   He declined statin 03/2016  . Iron deficiency anemia 03/2016; 03/2017   Hemoccults x 3 neg 05/2016.  Pt never took iron.  03/2017 iron still very low, mild anemia--hemoccults x 3 pos and I recommended pt start ferrous sulfate and arrange f/u with his GI MD.  Noncompliant with iron--saw GI MD 04/2017 and EGD showed esophagitis (o/w bx neg) likely causing his IDA.  Needs PPI bid x 48mo, then qd lifetime, plus oral iron replacement.  . Prostatitis   . Undescended left testicle    suspected to be atrophic    Past Surgical History:  Procedure Laterality Date  . COLONOSCOPY  age 60, then again 09/2014   Recall 09/2017 per [place] records  . ESOPHAGOGASTRODUODENOSCOPY  04/2017   Dr. Kizzie Ide, likely responsible for his IDA. (otherwise, bx was NEG).  . INGUINAL HERNIA REPAIR     1 as infant, one in 1970s, 1 in 91s  . PROSTATE BIOPSY  ?2004   benign per pt report    Outpatient Medications Prior to Visit  Medication Sig Dispense Refill  . aspirin EC 81 MG  tablet Take 81 mg by mouth.    . esomeprazole (NEXIUM) 40 MG packet Take 40 mg by mouth.    . Ferrous Sulfate Dried 200 (65 Fe) MG TABS Take by mouth 2 (two) times daily.     No facility-administered medications prior to visit.     No Known Allergies  ROS As per HPI  PE: Blood pressure (!) 145/87, pulse 61, temperature 97.8 F (36.6 C), temperature source Oral, resp. rate 16, weight 208 lb (94.3 kg), SpO2 98 %. Gen: Alert, well appearing.  Patient is oriented to person, place, time, and situation. AFFECT: pleasant, lucid thought and speech. No pallor or jaundice. No further exam today.  LABS:  Lab Results  Component Value Date   WBC 5.7 03/30/2017   HGB 11.2 (L) 03/30/2017   HCT 34.6 (L) 03/30/2017   MCV 93.1 03/30/2017   PLT 303.0 03/30/2017   Lab Results  Component Value Date   IRON 20 (L) 03/30/2017   IRON 30 (L) 03/30/2017   TIBC 466 (H) 03/30/2017   FERRITIN 5.7 (L) 03/30/2017     IMPRESSION AND PLAN:  1) IDA secondary to chronic slow blood loss from esophagitis. Doing well, feeling much better since being on iron. He'll stay on PPI qd indefinitely. Stop ASA--he has no indication  to be on this. Recheck CBC and iron panel today.  An After Visit Summary was printed and given to the patient.  FOLLOW UP: Return in about 10 months (around 04/07/2018) for annual CPE (fasting).  Signed:  Crissie Sickles, MD           06/07/2017

## 2017-06-08 LAB — IRON AND TIBC
IRON SATURATION: 15 % (ref 15–55)
Iron: 56 ug/dL (ref 38–169)
Total Iron Binding Capacity: 369 ug/dL (ref 250–450)
UIBC: 313 ug/dL (ref 111–343)

## 2017-07-07 DIAGNOSIS — L570 Actinic keratosis: Secondary | ICD-10-CM | POA: Diagnosis not present

## 2017-07-07 DIAGNOSIS — L820 Inflamed seborrheic keratosis: Secondary | ICD-10-CM | POA: Diagnosis not present

## 2017-07-07 DIAGNOSIS — Z85828 Personal history of other malignant neoplasm of skin: Secondary | ICD-10-CM | POA: Diagnosis not present

## 2017-07-07 DIAGNOSIS — D1800 Hemangioma unspecified site: Secondary | ICD-10-CM | POA: Diagnosis not present

## 2017-11-15 DIAGNOSIS — L821 Other seborrheic keratosis: Secondary | ICD-10-CM | POA: Diagnosis not present

## 2017-11-15 DIAGNOSIS — L820 Inflamed seborrheic keratosis: Secondary | ICD-10-CM | POA: Diagnosis not present

## 2017-12-27 ENCOUNTER — Telehealth: Payer: Self-pay

## 2017-12-27 DIAGNOSIS — L820 Inflamed seborrheic keratosis: Secondary | ICD-10-CM | POA: Diagnosis not present

## 2017-12-27 DIAGNOSIS — L570 Actinic keratosis: Secondary | ICD-10-CM | POA: Diagnosis not present

## 2017-12-27 DIAGNOSIS — L578 Other skin changes due to chronic exposure to nonionizing radiation: Secondary | ICD-10-CM | POA: Diagnosis not present

## 2017-12-27 DIAGNOSIS — M25572 Pain in left ankle and joints of left foot: Secondary | ICD-10-CM | POA: Diagnosis not present

## 2017-12-27 NOTE — Telephone Encounter (Signed)
I have no knowledge about his heel problem. What particular complaints is he having? Why did he see a dermatologist?  Which dermatologist did he see?

## 2017-12-27 NOTE — Telephone Encounter (Signed)
Copied from [place] [phone]. Topic: Referral - Question >> Dec 27, 2017  3:53 PM Conception Chancy, NT wrote: Reason for CRM: patient is calling and states he seen a dermatologist today in regards to his heel issue and states that he told him he needed to see a pediatrist but is unsure if he will need a referral or not. Please advise.

## 2017-12-28 NOTE — Telephone Encounter (Signed)
Patient stated that he is having left heel pain. Feels like a stone bruise when walking but not persistent. Patient unsure of needing referral, he would like a recommendation if no referral needed.

## 2017-12-28 NOTE — Telephone Encounter (Signed)
Patient notified and stated that hell is feeling some better but if it worsens or does not get better he will call back and schedule an appointment.

## 2017-12-28 NOTE — Telephone Encounter (Signed)
Have him make an appt with me to address this problem.-thx

## 2017-12-28 NOTE — Telephone Encounter (Signed)
Message left on voice mail for patient to return call. Okay for pec nurse to obtain information. See note.

## 2018-03-31 ENCOUNTER — Ambulatory Visit (INDEPENDENT_AMBULATORY_CARE_PROVIDER_SITE_OTHER): Payer: Medicare Other | Admitting: Family Medicine

## 2018-03-31 ENCOUNTER — Encounter: Payer: Self-pay | Admitting: Family Medicine

## 2018-03-31 VITALS — BP 136/82 | HR 56 | Temp 98.1°F | Resp 16 | Ht 69.5 in | Wt 209.2 lb

## 2018-03-31 DIAGNOSIS — E669 Obesity, unspecified: Secondary | ICD-10-CM | POA: Diagnosis not present

## 2018-03-31 DIAGNOSIS — Z0000 Encounter for general adult medical examination without abnormal findings: Secondary | ICD-10-CM | POA: Diagnosis not present

## 2018-03-31 DIAGNOSIS — Z23 Encounter for immunization: Secondary | ICD-10-CM | POA: Diagnosis not present

## 2018-03-31 DIAGNOSIS — Z125 Encounter for screening for malignant neoplasm of prostate: Secondary | ICD-10-CM | POA: Diagnosis not present

## 2018-03-31 LAB — COMPREHENSIVE METABOLIC PANEL
ALT: 14 U/L (ref 0–53)
AST: 18 U/L (ref 0–37)
Albumin: 4.5 g/dL (ref 3.5–5.2)
Alkaline Phosphatase: 52 U/L (ref 39–117)
BUN: 22 mg/dL (ref 6–23)
CHLORIDE: 105 meq/L (ref 96–112)
CO2: 29 meq/L (ref 19–32)
Calcium: 9.6 mg/dL (ref 8.4–10.5)
Creatinine, Ser: 1.16 mg/dL (ref 0.40–1.50)
GFR: 65.51 mL/min (ref >60.00)
GLUCOSE: 101 mg/dL — AB (ref 70–99)
Potassium: 4.4 mEq/L (ref 3.5–5.1)
Sodium: 140 mEq/L (ref 135–145)
Total Bilirubin: 0.4 mg/dL (ref 0.2–1.2)
Total Protein: 6.4 g/dL (ref 6.0–8.3)

## 2018-03-31 LAB — CBC WITH DIFFERENTIAL/PLATELET
BASOS ABS: 0.1 10*3/uL (ref 0.0–0.1)
Basophils Relative: 1.1 % (ref 0.0–3.0)
EOS ABS: 0.3 10*3/uL (ref 0.0–0.7)
Eosinophils Relative: 6 % — ABNORMAL HIGH (ref 0.0–5.0)
HEMATOCRIT: 44.1 % (ref 39.0–52.0)
Hemoglobin: 15.1 g/dL (ref 13.0–17.0)
LYMPHS PCT: 23.5 % (ref 12.0–46.0)
Lymphs Abs: 1.3 10*3/uL (ref 0.7–4.0)
MCHC: 34.2 g/dL (ref 30.0–36.0)
MCV: 94.7 fl (ref 78.0–100.0)
Monocytes Absolute: 0.6 10*3/uL (ref 0.1–1.0)
Monocytes Relative: 9.8 % (ref 3.0–12.0)
Neutro Abs: 3.4 10*3/uL (ref 1.4–7.7)
Neutrophils Relative %: 59.6 % (ref 43.0–77.0)
Platelets: 238 10*3/uL (ref 150.0–400.0)
RBC: 4.66 Mil/uL (ref 4.22–5.81)
RDW: 13.7 % (ref 11.5–15.5)
WBC: 5.7 10*3/uL (ref 4.0–10.5)

## 2018-03-31 LAB — PSA, MEDICARE: PSA: 1.51 ng/ml (ref 0.10–4.00)

## 2018-03-31 LAB — LIPID PANEL
CHOL/HDL RATIO: 5
Cholesterol: 226 mg/dL — ABNORMAL HIGH (ref 0–200)
HDL: 46.9 mg/dL (ref >39.00)
NonHDL: 179.19
TRIGLYCERIDES: 345 mg/dL — AB (ref 0.0–149.0)
VLDL: 69 mg/dL — ABNORMAL HIGH (ref 0.0–40.0)

## 2018-03-31 LAB — LDL CHOLESTEROL, DIRECT: LDL DIRECT: 125 mg/dL

## 2018-03-31 MED ORDER — ZOSTER VAC RECOMB ADJUVANTED 50 MCG/0.5ML IM SUSR: 0.5000 mL | Freq: Once | INTRAMUSCULAR | 1 refills | Status: AC

## 2018-03-31 MED ORDER — TETANUS-DIPHTH-ACELL PERTUSSIS 5-2.5-18.5 LF-MCG/0.5 IM SUSP: 0.5000 mL | Freq: Once | INTRAMUSCULAR | 0 refills | Status: AC

## 2018-03-31 NOTE — Patient Instructions (Signed)

## 2018-03-31 NOTE — Progress Notes (Signed)
Office Note 03/31/2018  CC:  Chief Complaint  Patient presents with  . Annual Exam    Pt is fasting.     HPI:  Matthew [name] is a 72 y.o. White male who is here for annual health maintenance exam.  Exercise: stays active but no formal exercise. Diet: trying to watch what he eats and lose a little wt.  No acute c/o.  Past Medical History:  Diagnosis Date  . Allergic rhinitis   . BRBPR (bright red blood per rectum)    Int hemorrhoids  . Family history of colon cancer in father   . Hearing impairment    Noise exposure  . History of diverticulitis of colon 03/2014   F/u CT showed resolution.  . Hyperlipidemia 03/2016   He declined statin 03/2016  . Iron deficiency anemia 03/2016; 03/2017   Hemoccults x 3 neg 05/2016.  Pt never took iron.  03/2017 iron still very low, mild anemia--hemoccults x 3 pos and I recommended pt start ferrous sulfate and arrange f/u with his GI MD.  Noncompliant with iron--saw GI MD 04/2017 and EGD showed esophagitis (o/w bx neg) likely causing his IDA.  Needs PPI bid x 68mo, then qd lifetime, plus oral iron replacement.  . Prostatitis   . Undescended left testicle    suspected to be atrophic    Past Surgical History:  Procedure Laterality Date  . COLONOSCOPY  age 73, then again 09/2014   Recall 09/2017 per [facility] records  . ESOPHAGOGASTRODUODENOSCOPY  04/2017   Dr. Kizzie Ide, likely responsible for his IDA. (otherwise, bx was NEG).  . INGUINAL HERNIA REPAIR     1 as infant, one in 1970s, 1 in 76s  . PROSTATE BIOPSY  ?2004   benign per pt report    Family History  Problem Relation Age of Onset  . Cancer Father        colon and lung    Social History   Socioeconomic History  . Marital status: Married    Spouse name: Not on file  . Number of children: Not on file  . Years of education: Not on file  . Highest education level: Not on file  Occupational History  . Not on file  Social Needs  . Financial resource strain: Not  on file  . Food insecurity:    Worry: Not on file    Inability: Not on file  . Transportation needs:    Medical: Not on file    Non-medical: Not on file  Tobacco Use  . Smoking status: Former Research scientist (life sciences)  . Smokeless tobacco: Former Systems developer    Types: Chew  Substance and Sexual Activity  . Alcohol use: Yes  . Drug use: No  . Sexual activity: Not on file  Lifestyle  . Physical activity:    Days per week: Not on file    Minutes per session: Not on file  . Stress: Not on file  Relationships  . Social connections:    Talks on phone: Not on file    Gets together: Not on file    Attends religious service: Not on file    Active member of club or organization: Not on file    Attends meetings of clubs or organizations: Not on file    Relationship status: Not on file  . Intimate partner violence:    Fear of current or ex partner: Not on file    Emotionally abused: Not on file    Physically abused: Not on file  Forced sexual activity: Not on file  Other Topics Concern  . Not on file  Social History Narrative   Married, 1 son.   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   No tobacco, rare alcohol, no drugs.    Outpatient Medications Prior to Visit  Medication Sig Dispense Refill  . esomeprazole (NEXIUM) 40 MG packet Take 40 mg by mouth.    . Ferrous Sulfate Dried 200 (65 Fe) MG TABS Take by mouth 2 (two) times daily.    Marland Kitchen aspirin EC 81 MG tablet Take 81 mg by mouth.     No facility-administered medications prior to visit.     No Known Allergies  ROS Review of Systems  Constitutional: Negative for appetite change, chills, fatigue and fever.  HENT: Negative for congestion, dental problem, ear pain and sore throat.   Eyes: Negative for discharge, redness and visual disturbance.  Respiratory: Negative for cough, chest tightness, shortness of breath and wheezing.   Cardiovascular: Negative for chest pain, palpitations and leg swelling.  Gastrointestinal: Negative  for abdominal pain, blood in stool, diarrhea, nausea and vomiting.  Genitourinary: Negative for difficulty urinating, dysuria, flank pain, frequency, hematuria and urgency.  Musculoskeletal: Negative for arthralgias, back pain, joint swelling, myalgias and neck stiffness.  Skin: Negative for pallor and rash.  Neurological: Negative for dizziness, speech difficulty, weakness and headaches.  Hematological: Negative for adenopathy. Does not bruise/bleed easily.  Psychiatric/Behavioral: Negative for confusion and sleep disturbance. The patient is not nervous/anxious.     PE; Blood pressure 136/82, pulse (!) 56, temperature 98.1 F (36.7 C), temperature source Oral, resp. rate 16, height 5' 9.5" (1.765 m), weight 209 lb 4 oz (94.9 kg), SpO2 97 %. Gen: Alert, well appearing.  Patient is oriented to person, place, time, and situation. AFFECT: pleasant, lucid thought and speech. ENT: Ears: EACs clear, normal epithelium.  TMs with good light reflex and landmarks bilaterally.  Eyes: no injection, icteris, swelling, or exudate.  EOMI, PERRLA. Nose: no drainage or turbinate edema/swelling.  No injection or focal lesion.  Mouth: lips without lesion/swelling.  Oral mucosa pink and moist.  Dentition intact and without obvious caries or gingival swelling.  Oropharynx without erythema, exudate, or swelling.  Neck: supple/nontender.  No LAD, mass, or TM.  Carotid pulses 2+ bilaterally, without bruits. CV: RRR, no m/r/g.   LUNGS: CTA bilat, nonlabored resps, good aeration in all lung fields. ABD: soft, NT, ND, BS normal.  No hepatospenomegaly or mass.  No bruits. EXT: no clubbing, cyanosis, or edema.  Musculoskeletal: no joint swelling, erythema, warmth, or tenderness.  ROM of all joints intact. Skin - no sores or suspicious lesions or rashes or color changes Rectal exam: negative without mass, lesions or tenderness, PROSTATE EXAM: smooth and symmetric without nodules or tenderness.   Pertinent labs:  No  results found for: TSH Lab Results  Component Value Date   WBC 5.3 06/07/2017   HGB 13.5 06/07/2017   HCT 41.3 06/07/2017   MCV 93.5 06/07/2017   PLT 252.0 06/07/2017   Lab Results  Component Value Date   CREATININE 0.98 03/30/2017   BUN 23 03/30/2017   NA 141 03/30/2017   K 4.4 03/30/2017   CL 106 03/30/2017   CO2 28 03/30/2017   Lab Results  Component Value Date   ALT 12 03/30/2017   AST 16 03/30/2017   ALKPHOS 51 03/30/2017   BILITOT 0.3 03/30/2017   Lab Results  Component Value Date   CHOL 187 03/30/2017  Lab Results  Component Value Date   HDL 43.00 03/30/2017   No results found for: [facility] Lab Results  Component Value Date   TRIG 203.0 (H) 03/30/2017   Lab Results  Component Value Date   CHOLHDL 4 03/30/2017   Lab Results  Component Value Date   PSA 1.66 03/30/2017   PSA 1.67 03/18/2016    ASSESSMENT AND PLAN:   Health maintenance exam: Reviewed age and gender appropriate health maintenance issues (prudent diet, regular exercise, health risks of tobacco and excessive alcohol, use of seatbelts, fire alarms in home, use of sunscreen).  Also reviewed age and gender appropriate health screening as well as vaccine recommendations. Vaccines: Tdap-->rx printed.    Flu vaccine-->given today.   Shingrix-->rx printed. Labs: CBC, CMET, FLP, PSA. Prostate ca screening: DRE normal , PSA.  If normal, this will be his final prostate cancer screen (due to age). Colon ca screening: colonoscopy was due 09/2017 (+FH of CC)-->pt aware and will schedule at his convenience.  An After Visit Summary was printed and given to the patient.  FOLLOW UP:  No follow-ups on file.  Signed:  Crissie Sickles, MD           03/31/2018

## 2018-05-03 ENCOUNTER — Telehealth: Payer: Self-pay | Admitting: *Deleted

## 2018-05-03 DIAGNOSIS — H9193 Unspecified hearing loss, bilateral: Secondary | ICD-10-CM

## 2018-05-03 NOTE — Telephone Encounter (Signed)
Please advise. Thanks.  

## 2018-05-03 NOTE — Telephone Encounter (Signed)
OK, audiology referral ordered per pt request.

## 2018-05-03 NOTE — Telephone Encounter (Signed)
Copied from [place] [phone] [phone]. Topic: Referral - Request for Referral >> May 03, 2018  9:27 AM Sheran Luz wrote: Has patient seen PCP for this complaint? Not specifically but patient states Dr. Anitra Lauth is aware.   Referral for which specialty: Audiology  Preferred provider/office: [facility]  Reason for referral: Patient wears hearing aides, states he tried to make appointment but they said he would need referral.

## 2018-05-04 NOTE — Telephone Encounter (Signed)
Pt advised and voiced understanding.   

## 2018-05-05 DIAGNOSIS — H90A22 Sensorineural hearing loss, unilateral, left ear, with restricted hearing on the contralateral side: Secondary | ICD-10-CM | POA: Diagnosis not present

## 2018-05-12 DIAGNOSIS — H903 Sensorineural hearing loss, bilateral: Secondary | ICD-10-CM | POA: Diagnosis not present

## 2018-05-18 DIAGNOSIS — H905 Unspecified sensorineural hearing loss: Secondary | ICD-10-CM | POA: Diagnosis not present

## 2018-05-18 DIAGNOSIS — H903 Sensorineural hearing loss, bilateral: Secondary | ICD-10-CM | POA: Diagnosis not present

## 2018-07-11 DIAGNOSIS — Z85828 Personal history of other malignant neoplasm of skin: Secondary | ICD-10-CM | POA: Diagnosis not present

## 2018-07-11 DIAGNOSIS — D1800 Hemangioma unspecified site: Secondary | ICD-10-CM | POA: Diagnosis not present

## 2018-07-11 DIAGNOSIS — Z1283 Encounter for screening for malignant neoplasm of skin: Secondary | ICD-10-CM | POA: Diagnosis not present

## 2018-07-11 DIAGNOSIS — D229 Melanocytic nevi, unspecified: Secondary | ICD-10-CM | POA: Diagnosis not present

## 2018-11-28 ENCOUNTER — Ambulatory Visit (INDEPENDENT_AMBULATORY_CARE_PROVIDER_SITE_OTHER): Payer: Medicare Other | Admitting: Family Medicine

## 2018-11-28 ENCOUNTER — Other Ambulatory Visit (INDEPENDENT_AMBULATORY_CARE_PROVIDER_SITE_OTHER): Payer: Medicare Other

## 2018-11-28 ENCOUNTER — Other Ambulatory Visit: Payer: Self-pay

## 2018-11-28 ENCOUNTER — Encounter: Payer: Self-pay | Admitting: Family Medicine

## 2018-11-28 ENCOUNTER — Telehealth: Payer: Self-pay | Admitting: Family Medicine

## 2018-11-28 VITALS — BP 117/79 | HR 78 | Temp 97.6°F | Resp 16 | Ht 69.5 in | Wt 198.4 lb

## 2018-11-28 DIAGNOSIS — R350 Frequency of micturition: Secondary | ICD-10-CM

## 2018-11-28 DIAGNOSIS — R3129 Other microscopic hematuria: Secondary | ICD-10-CM

## 2018-11-28 DIAGNOSIS — N3001 Acute cystitis with hematuria: Secondary | ICD-10-CM

## 2018-11-28 LAB — URINALYSIS, ROUTINE W REFLEX MICROSCOPIC
Bilirubin Urine: NEGATIVE
Ketones, ur: NEGATIVE
Leukocytes,Ua: NEGATIVE
Nitrite: NEGATIVE
Specific Gravity, Urine: 1.025 (ref 1.000–1.030)
Urine Glucose: NEGATIVE
Urobilinogen, UA: 0.2 (ref 0.0–1.0)
pH: 5.5 (ref 5.0–8.0)

## 2018-11-28 LAB — POCT URINALYSIS DIPSTICK
Bilirubin, UA: NEGATIVE
Glucose, UA: NEGATIVE
Ketones, UA: NEGATIVE
Leukocytes, UA: NEGATIVE
Nitrite, UA: NEGATIVE
Protein, UA: POSITIVE — AB
Spec Grav, UA: 1.025 (ref 1.010–1.025)
Urobilinogen, UA: 0.2 E.U./dL
pH, UA: 5.5 (ref 5.0–8.0)

## 2018-11-28 LAB — GLUCOSE, POCT (MANUAL RESULT ENTRY): POC Glucose: 130 mg/dl — AB (ref 70–99)

## 2018-11-28 MED ORDER — SULFAMETHOXAZOLE-TRIMETHOPRIM 800-160 MG PO TABS: 1.0000 | ORAL_TABLET | Freq: Two times a day (BID) | ORAL | 0 refills | Status: DC

## 2018-11-28 NOTE — Telephone Encounter (Signed)
OK to work in this afternoon

## 2018-11-28 NOTE — Telephone Encounter (Signed)
No openings until Wednesday, Please advise, thanks.

## 2018-11-28 NOTE — Telephone Encounter (Signed)
Patient is requesting appointment, no in office visits available. Patient experiencing frequent urination, general fatigue, no thermometer so not sure if he has one, no chills/sweats.

## 2018-11-28 NOTE — Progress Notes (Signed)
OFFICE VISIT  11/28/2018   CC:  Chief Complaint  Patient presents with  . Urinary Frequency   HPI:    Patient is a 73 y.o. Caucasian Matthew [name] who presents for urinary complaints. Onset 5 days ago, urinary frequency and some urgency, urine is darker color than normal, fatigued. No dysuria.  Frequency more in daytime, not much in night.  No sense of incomplete emptying. Felt low grade subjective fever early in the illness.  The fatigue is the first thing he noted about this illness. Some diffuse myalgias first 1-2 days, mild, resolved.  No HAs. Appetite is down.  He is pushing himself to [place].  No abd pain, constipation, or diarrhea.  No cough, CP, dizziness, or SOB.  He works a lot outside and does recall pulling ticks off of himself. No hx of rash.  No joints have been swollen or red.   Past Medical History:  Diagnosis Date  . Allergic rhinitis   . BRBPR (bright red blood per rectum)    Int hemorrhoids  . Family history of colon cancer in father   . Hearing impairment    Noise exposure  . History of diverticulitis of colon 03/2014   F/u CT showed resolution.  . Hyperlipidemia 03/2016   He declined statin 03/2016  . Iron deficiency anemia 03/2016; 03/2017   Hemoccults x 3 neg 05/2016.  Pt never took iron.  03/2017 iron still very low, mild anemia--hemoccults x 3 pos and I recommended pt start ferrous sulfate and arrange f/u with his GI MD.  Noncompliant with iron--saw GI MD 04/2017 and EGD showed esophagitis (o/w bx neg) likely causing his IDA.  Needs PPI bid x 87mo, then qd lifetime, plus oral iron replacement.  . Prostatitis   . Undescended left testicle    suspected to be atrophic    Past Surgical History:  Procedure Laterality Date  . COLONOSCOPY  age 71, then again 09/2014   Recall 09/2017 per [place] records  . ESOPHAGOGASTRODUODENOSCOPY  04/2017   Dr. Kizzie Ide, likely responsible for his IDA. (otherwise, bx was NEG).  . INGUINAL HERNIA REPAIR     1 as  infant, one in 1970s, 1 in 60s  . PROSTATE BIOPSY  ?2004   benign per pt report    Outpatient Medications Prior to Visit  Medication Sig Dispense Refill  . Ferrous Sulfate Dried 200 (65 Fe) MG TABS Take by mouth 2 (two) times daily.    Marland Kitchen esomeprazole (NEXIUM) 40 MG packet Take 40 mg by mouth.     No facility-administered medications prior to visit.     No Known Allergies  ROS As per HPI  PE: Blood pressure 117/79, pulse 78, temperature 97.6 F (36.4 C), temperature source Oral, resp. rate 16, height 5' 9.5" (1.765 m), weight 198 lb 6.4 oz (90 kg), SpO2 94 %. Gen: Alert, well appearing.  Patient is oriented to person, place, time, and situation. He is HOH. AFFECT: pleasant, lucid thought and speech. CV: RRR, no m/r/g.   LUNGS: CTA bilat, nonlabored resps, good aeration in all lung fields. ABD: soft, NT/ND EXT: no clubbing or cyanosis.  no edema.  Rectal exam: negative without mass, lesions or tenderness, PROSTATE EXAM: smooth and symmetric without nodules or tenderness.   LABS:    Chemistry      Component Value Date/Time   NA 140 03/31/2018 0834   K 4.4 03/31/2018 0834   CL 105 03/31/2018 0834   CO2 29 03/31/2018 0834   BUN 22 03/31/2018 0834  CREATININE 1.16 03/31/2018 0834      Component Value Date/Time   CALCIUM 9.6 03/31/2018 0834   ALKPHOS 52 03/31/2018 0834   AST 18 03/31/2018 0834   ALT 14 03/31/2018 0834   BILITOT 0.4 03/31/2018 0834     Lab Results  Component Value Date   WBC 5.7 03/31/2018   HGB 15.1 03/31/2018   HCT 44.1 03/31/2018   MCV 94.7 03/31/2018   PLT 238.0 03/31/2018   Dipstick CC UA today showed + protein, 3+ blood. Urine appearance was dark yellow.  Random glucose (he last ate some cereal about 3 hours ago)->130.  IMPRESSION AND PLAN:  Acute UTI with microhematuria. Bactrim DS bid x 14d. Need to see him for recheck at end of abx to make sure he feels back to normal and check UA to see that his blood has cleared up  completely.  An After Visit Summary was printed and given to the patient.  FOLLOW UP: Return in about 2 weeks (around 12/12/2018) for f/u prostatitis/microhematuria.  Signed:  Crissie Sickles, MD           11/28/2018

## 2018-11-30 LAB — URINE CULTURE
MICRO NUMBER:: 617284
Result:: NO GROWTH
SPECIMEN QUALITY:: ADEQUATE

## 2018-12-05 ENCOUNTER — Other Ambulatory Visit: Payer: Self-pay | Admitting: Family Medicine

## 2018-12-05 ENCOUNTER — Telehealth: Payer: Self-pay

## 2018-12-05 MED ORDER — DOXYCYCLINE HYCLATE 100 MG PO CAPS: 100.0000 mg | ORAL_CAPSULE | Freq: Two times a day (BID) | ORAL | 0 refills | Status: AC

## 2018-12-05 NOTE — Telephone Encounter (Signed)
Noted. Reviewed my note from his most recent o/v. I changed abx to doxy today to cover for possible tick born illness.  He has stopped his bactrim b/c it made him feel "sick".  Signed:  Crissie Sickles, MD           12/05/2018

## 2018-12-05 NOTE — Telephone Encounter (Signed)
Received voicemail from patient over the weekend but he did not state reason for call. Pt was contacted to make sure he was doing okay. He stated his temp was fluctuating from 97.0 to 102. He checked his temp while on the phone and it was 97.0, he had also been taking Tylenol 500mg  2-3x a day. Pt denied having any other symptoms. Offered virtual visit with PCP for tomorrow and scheduled.

## 2018-12-06 ENCOUNTER — Encounter: Payer: Self-pay | Admitting: Family Medicine

## 2018-12-06 ENCOUNTER — Ambulatory Visit (INDEPENDENT_AMBULATORY_CARE_PROVIDER_SITE_OTHER): Payer: Medicare Other | Admitting: Family Medicine

## 2018-12-06 ENCOUNTER — Other Ambulatory Visit: Payer: Self-pay

## 2018-12-06 VITALS — BP 112/71 | HR 78 | Temp 98.1°F

## 2018-12-06 DIAGNOSIS — R350 Frequency of micturition: Secondary | ICD-10-CM

## 2018-12-06 DIAGNOSIS — B882 Other arthropod infestations: Secondary | ICD-10-CM | POA: Diagnosis not present

## 2018-12-06 DIAGNOSIS — R509 Fever, unspecified: Secondary | ICD-10-CM

## 2018-12-06 DIAGNOSIS — R3129 Other microscopic hematuria: Secondary | ICD-10-CM | POA: Diagnosis not present

## 2018-12-06 NOTE — Progress Notes (Signed)
Virtual Visit via Video Note  I connected with Matthew [name] on 12/06/18 at  8:00 AM EDT by a video enabled telemedicine application and verified that I am speaking with the correct person using two identifiers.  Location patient: home Location provider:work or home office Persons participating in the virtual visit: patient, provider  I discussed the limitations of evaluation and management by telemedicine and the availability of in person appointments. The patient expressed understanding and agreed to proceed.  Telemedicine visit is a necessity given the COVID-19 restrictions in place at the current time.  HPI: 73 y/o WM being seen today for f/u of recent febrile illness. Due to a mixed constellation of symptoms, I was considering either UTI/prostatitis vs more of a tick borne disease.  I started him on 14d course of bactrim.  He called back stating his fevers were continuing and the bactrim was making him feel worse so he stopped it.  I then started him on doxycycline yesterday.  Interim hx: Afebrile last night, slept through the night for the first time during this illness. Still with some fatigue.  No achiness.  His urinary frequency has subsided.  No HA.  No cough or ST.  No rash.    ROS: no CP, no SOB, no wheezing, no cough, no dizziness, no HAs, no rashes, no melena/hematochezia.  No polyuria or polydipsia.  No myalgias or arthralgias.   Past Medical History:  Diagnosis Date  . Allergic rhinitis   . BRBPR (bright red blood per rectum)    Int hemorrhoids  . Family history of colon cancer in father   . Hearing impairment    Noise exposure  . History of diverticulitis of colon 03/2014   F/u CT showed resolution.  . Hyperlipidemia 03/2016   He declined statin 03/2016  . Iron deficiency anemia 03/2016; 03/2017   Hemoccults x 3 neg 05/2016.  Matthew [name] never took iron.  03/2017 iron still very low, mild anemia--hemoccults x 3 pos and I recommended Matthew [name] start ferrous sulfate and arrange f/u with his  GI MD.  Noncompliant with iron--saw GI MD 04/2017 and EGD showed esophagitis (o/w bx neg) likely causing his IDA.  Needs PPI bid x 44mo, then qd lifetime, plus oral iron replacement.  . Prostatitis   . Undescended left testicle    suspected to be atrophic    Past Surgical History:  Procedure Laterality Date  . COLONOSCOPY  age 55, then again 09/2014   Recall 09/2017 per [facility] records  . ESOPHAGOGASTRODUODENOSCOPY  04/2017   Dr. Kizzie Ide, likely responsible for his IDA. (otherwise, bx was NEG).  . INGUINAL HERNIA REPAIR     1 as infant, one in 1970s, 1 in 25s  . PROSTATE BIOPSY  ?2004   benign per Matthew [name] report    Family History  Problem Relation Age of Onset  . Cancer Father        colon and lung      Current Outpatient Medications:  .  doxycycline (VIBRAMYCIN) 100 MG capsule, Take 1 capsule (100 mg total) by mouth 2 (two) times daily for 10 days., Disp: 20 capsule, Rfl: 0 .  Ferrous Sulfate Dried 200 (65 Fe) MG TABS, Take by mouth 2 (two) times daily., Disp: , Rfl:   EXAM:  VITALS per patient if applicable: BP 024/09 (BP Location: Left Arm, Patient Position: Sitting, Cuff Size: Normal)   Pulse 78   Temp 98.1 F (36.7 C) (Oral)    GENERAL: alert, oriented, appears well and in no acute distress  HEENT: atraumatic, conjunttiva clear, no obvious abnormalities on inspection of external nose and ears  NECK: normal movements of the head and neck  LUNGS: on inspection no signs of respiratory distress, breathing rate appears normal, no obvious gross SOB, gasping or wheezing  CV: no obvious cyanosis  MS: moves all visible extremities without noticeable abnormality  PSYCH/NEURO: pleasant and cooperative, no obvious depression or anxiety, speech and thought processing grossly intact  LABS: none today.  11/28/18: dipstick UA + blood and protein.  Micro confirmed 8-10 RBCs per HPF, trace protein.   His urine culture showed no growth.  Lab Results  Component Value  Date   WBC 5.7 03/31/2018   HGB 15.1 03/31/2018   HCT 44.1 03/31/2018   MCV 94.7 03/31/2018   PLT 238.0 03/31/2018     Chemistry      Component Value Date/Time   NA 140 03/31/2018 0834   K 4.4 03/31/2018 0834   CL 105 03/31/2018 0834   CO2 29 03/31/2018 0834   BUN 22 03/31/2018 0834   CREATININE 1.16 03/31/2018 0834      Component Value Date/Time   CALCIUM 9.6 03/31/2018 0834   ALKPHOS 52 03/31/2018 0834   AST 18 03/31/2018 0834   ALT 14 03/31/2018 0834   BILITOT 0.4 03/31/2018 0834      ASSESSMENT AND PLAN:  Discussed the following assessment and plan:  Febrile illness: with focal urinary sx's and abnormal UA I initially treated this as prostatitis/cystitis ---bactrim. Now he seems almost back to normal (except mild residual fatigue) after just 2 doses of doxycycline that I started for possible tick born dz.  Viral syndrome certainly a possible explanation as well. At any rate, I told him to finish the course of doxycycline and to call back if his urinary sx's return. I still want him to keep his f/u appt so we can recheck his urine to see if microhematuria is resolved.  I discussed the assessment and treatment plan with the patient. The patient was provided an opportunity to ask questions and all were answered. The patient agreed with the plan and demonstrated an understanding of the instructions.   The patient was advised to call back or seek an in-person evaluation if the symptoms worsen or if the condition fails to improve as anticipated.  F/u: keep appt on 12/13/18--and recheck urine (microhematuria) at that time  Signed:  Crissie Sickles, MD           12/06/2018

## 2018-12-13 ENCOUNTER — Other Ambulatory Visit: Payer: Self-pay

## 2018-12-13 ENCOUNTER — Ambulatory Visit (INDEPENDENT_AMBULATORY_CARE_PROVIDER_SITE_OTHER): Payer: Medicare Other | Admitting: Family Medicine

## 2018-12-13 ENCOUNTER — Encounter: Payer: Self-pay | Admitting: Family Medicine

## 2018-12-13 VITALS — BP 110/66 | HR 58 | Temp 98.1°F | Resp 16 | Ht 69.5 in | Wt 196.8 lb

## 2018-12-13 DIAGNOSIS — B882 Other arthropod infestations: Secondary | ICD-10-CM | POA: Diagnosis not present

## 2018-12-13 DIAGNOSIS — R3129 Other microscopic hematuria: Secondary | ICD-10-CM | POA: Diagnosis not present

## 2018-12-13 LAB — COMPREHENSIVE METABOLIC PANEL
ALT: 16 U/L (ref 0–53)
AST: 18 U/L (ref 0–37)
Albumin: 3.9 g/dL (ref 3.5–5.2)
Alkaline Phosphatase: 46 U/L (ref 39–117)
BUN: 20 mg/dL (ref 6–23)
CO2: 27 mEq/L (ref 19–32)
Calcium: 8.9 mg/dL (ref 8.4–10.5)
Chloride: 107 mEq/L (ref 96–112)
Creatinine, Ser: 0.96 mg/dL (ref 0.40–1.50)
GFR: 76.53 mL/min (ref >60.00)
Glucose, Bld: 91 mg/dL (ref 70–99)
Potassium: 4.6 mEq/L (ref 3.5–5.1)
Sodium: 142 mEq/L (ref 135–145)
Total Bilirubin: 0.5 mg/dL (ref 0.2–1.2)
Total Protein: 5.9 g/dL — ABNORMAL LOW (ref 6.0–8.3)

## 2018-12-13 LAB — CBC WITH DIFFERENTIAL/PLATELET
Basophils Absolute: 0.1 10*3/uL (ref 0.0–0.1)
Basophils Relative: 0.8 % (ref 0.0–3.0)
Eosinophils Absolute: 0.1 10*3/uL (ref 0.0–0.7)
Eosinophils Relative: 1.8 % (ref 0.0–5.0)
HCT: 39.5 % (ref 39.0–52.0)
Hemoglobin: 13.1 g/dL (ref 13.0–17.0)
Lymphocytes Relative: 51.1 % — ABNORMAL HIGH (ref 12.0–46.0)
Lymphs Abs: 3.9 10*3/uL (ref 0.7–4.0)
MCHC: 33.1 g/dL (ref 30.0–36.0)
MCV: 93.8 fl (ref 78.0–100.0)
Monocytes Absolute: 0.5 10*3/uL (ref 0.1–1.0)
Monocytes Relative: 7 % (ref 3.0–12.0)
Neutro Abs: 3 10*3/uL (ref 1.4–7.7)
Neutrophils Relative %: 39.3 % — ABNORMAL LOW (ref 43.0–77.0)
Platelets: 297 10*3/uL (ref 150.0–400.0)
RBC: 4.21 Mil/uL — ABNORMAL LOW (ref 4.22–5.81)
RDW: 14.3 % (ref 11.5–15.5)
WBC: 7.7 10*3/uL (ref 4.0–10.5)

## 2018-12-13 LAB — URINALYSIS, ROUTINE W REFLEX MICROSCOPIC
Bilirubin Urine: NEGATIVE
Ketones, ur: NEGATIVE
Leukocytes,Ua: NEGATIVE
Nitrite: NEGATIVE
Specific Gravity, Urine: >=1.03 — AB (ref 1.000–1.030)
Total Protein, Urine: NEGATIVE
Urine Glucose: NEGATIVE
Urobilinogen, UA: 0.2 (ref 0.0–1.0)
pH: 5 (ref 5.0–8.0)

## 2018-12-13 NOTE — Progress Notes (Signed)
OFFICE VISIT  12/13/2018   CC:  Chief Complaint  Patient presents with  . Follow-up    suspected tick born illness   HPI:    Patient is a 73 y.o. Caucasian male who presents for 1 week f/u suspected tick born illness. I saw him initially 2 wks ago and treated as prostatitis b/c of prominent urinary frequency complaint, but urine showed no growth and he did not get any better with bactrim. Changed to doxycycline and he felt better after just a couple doses->unsure if b/c of doxy or possibly he just had a viral syndrome mimicking tick born dz sx's. At any rate, he had microhematuria on UA so I wanted him to return for recheck today no matter what.  Interim hx: He is feeling better, nearly back to normal.  Energy level not 100%  No fevers. No dysuria, urinary frequency, urgency, or gross hematuria or foul smell of urine.  ROS: no CP, no SOB, no wheezing, no cough, no dizziness, no HAs, no rashes, no melena/hematochezia.  No polyuria or polydipsia.  No myalgias or arthralgias.    Past Medical History:  Diagnosis Date  . Allergic rhinitis   . BRBPR (bright red blood per rectum)    Int hemorrhoids  . Family history of colon cancer in father   . Hearing impairment    Noise exposure  . History of diverticulitis of colon 03/2014   F/u CT showed resolution.  . Hyperlipidemia 03/2016   He declined statin 03/2016  . Iron deficiency anemia 03/2016; 03/2017   Hemoccults x 3 neg 05/2016.  Pt never took iron.  03/2017 iron still very low, mild anemia--hemoccults x 3 pos and I recommended pt start ferrous sulfate and arrange f/u with his GI MD.  Noncompliant with iron--saw GI MD 04/2017 and EGD showed esophagitis (o/w bx neg) likely causing his IDA.  Needs PPI bid x 79mo, then qd lifetime, plus oral iron replacement.  . Prostatitis   . Undescended left testicle    suspected to be atrophic    Past Surgical History:  Procedure Laterality Date  . COLONOSCOPY  age 46, then again 09/2014   Recall 09/2017 per [facility] records  . ESOPHAGOGASTRODUODENOSCOPY  04/2017   Dr. Kizzie Ide, likely responsible for his IDA. (otherwise, bx was NEG).  . INGUINAL HERNIA REPAIR     1 as infant, one in 1970s, 1 in 52s  . PROSTATE BIOPSY  ?2004   benign per pt report    Outpatient Medications Prior to Visit  Medication Sig Dispense Refill  . doxycycline (VIBRAMYCIN) 100 MG capsule Take 1 capsule (100 mg total) by mouth 2 (two) times daily for 10 days. 20 capsule 0  . Ferrous Sulfate Dried 200 (65 Fe) MG TABS Take by mouth 2 (two) times daily.     No facility-administered medications prior to visit.     Allergies  Allergen Reactions  . Sulfa Antibiotics Other (See Comments)    "sick"    ROS As per HPI  PE: Blood pressure 110/66, pulse (!) 58, temperature 98.1 F (36.7 C), temperature source Temporal, resp. rate 16, height 5' 9.5" (1.765 m), weight 196 lb 12.8 oz (89.3 kg), SpO2 99 %. Body mass index is 28.65 kg/m.  Gen: Alert, well appearing.  Patient is oriented to person, place, time, and situation. AFFECT: pleasant, lucid thought and speech. IOX:BDZH: no injection, icteris, swelling, or exudate.  EOMI, PERRLA. Mouth: lips without lesion/swelling.  Oral mucosa pink and moist. Oropharynx without erythema, exudate, or swelling.  CV: RRR, no m/r/g.   LUNGS: CTA bilat, nonlabored resps, good aeration in all lung fields. EXT: no clubbing or cyanosis.  1+ pitting edema bilat. Skin - no sores or suspicious lesions or rashes or color changes     LABS:  Urine microscopy 11/28/18 showed 7-10 RBC's/hpf  Lab Results  Component Value Date   WBC 5.7 03/31/2018   HGB 15.1 03/31/2018   HCT 44.1 03/31/2018   MCV 94.7 03/31/2018   PLT 238.0 03/31/2018     Chemistry      Component Value Date/Time   NA 140 03/31/2018 0834   K 4.4 03/31/2018 0834   CL 105 03/31/2018 0834   CO2 29 03/31/2018 0834   BUN 22 03/31/2018 0834   CREATININE 1.16 03/31/2018 0834      Component  Value Date/Time   CALCIUM 9.6 03/31/2018 0834   ALKPHOS 52 03/31/2018 0834   AST 18 03/31/2018 0834   ALT 14 03/31/2018 0834   BILITOT 0.4 03/31/2018 0834      IMPRESSION AND PLAN:  Tick born illness vs viral syndrome: resolving.  He is almost back to 100% well. He requests CBC w/diff and CMET today and I think this is reasonable since our diagnosis has been uncertain AND hx recent hx of microhematuria.   We'll also get UA with reflex microscopy to f/u the microhematuria he had 2 wks ago. Finish the last few days of doxycycline. Signs/symptoms to call or return for were reviewed and pt expressed understanding.  An After Visit Summary was printed and given to the patient.  FOLLOW UP: Return in about 3 months (around 03/15/2019) for annual CPE (fasting).  Signed:  Crissie Sickles, MD           12/13/2018

## 2019-02-13 DIAGNOSIS — Z0120 Encounter for dental examination and cleaning without abnormal findings: Secondary | ICD-10-CM | POA: Diagnosis not present

## 2019-04-05 ENCOUNTER — Encounter: Payer: Self-pay | Admitting: Family Medicine

## 2019-04-05 ENCOUNTER — Other Ambulatory Visit: Payer: Self-pay

## 2019-04-05 ENCOUNTER — Encounter: Payer: Medicare Other | Admitting: Family Medicine

## 2019-04-05 ENCOUNTER — Ambulatory Visit (INDEPENDENT_AMBULATORY_CARE_PROVIDER_SITE_OTHER): Payer: Medicare Other | Admitting: Family Medicine

## 2019-04-05 VITALS — BP 130/79 | HR 59 | Temp 98.5°F | Resp 16 | Ht 69.5 in | Wt 205.4 lb

## 2019-04-05 DIAGNOSIS — Z862 Personal history of diseases of the blood and blood-forming organs and certain disorders involving the immune mechanism: Secondary | ICD-10-CM | POA: Diagnosis not present

## 2019-04-05 DIAGNOSIS — Z23 Encounter for immunization: Secondary | ICD-10-CM

## 2019-04-05 DIAGNOSIS — Z125 Encounter for screening for malignant neoplasm of prostate: Secondary | ICD-10-CM

## 2019-04-05 DIAGNOSIS — Z1159 Encounter for screening for other viral diseases: Secondary | ICD-10-CM

## 2019-04-05 DIAGNOSIS — Z0000 Encounter for general adult medical examination without abnormal findings: Secondary | ICD-10-CM | POA: Diagnosis not present

## 2019-04-05 DIAGNOSIS — E7800 Pure hypercholesterolemia, unspecified: Secondary | ICD-10-CM

## 2019-04-05 DIAGNOSIS — Z8719 Personal history of other diseases of the digestive system: Secondary | ICD-10-CM | POA: Diagnosis not present

## 2019-04-05 LAB — COMPREHENSIVE METABOLIC PANEL
ALT: 9 U/L (ref 0–53)
AST: 16 U/L (ref 0–37)
Albumin: 4.3 g/dL (ref 3.5–5.2)
Alkaline Phosphatase: 55 U/L (ref 39–117)
BUN: 22 mg/dL (ref 6–23)
CO2: 27 mEq/L (ref 19–32)
Calcium: 9.3 mg/dL (ref 8.4–10.5)
Chloride: 107 mEq/L (ref 96–112)
Creatinine, Ser: 1.1 mg/dL (ref 0.40–1.50)
GFR: 65.35 mL/min (ref >60.00)
Glucose, Bld: 104 mg/dL — ABNORMAL HIGH (ref 70–99)
Potassium: 4.5 mEq/L (ref 3.5–5.1)
Sodium: 141 mEq/L (ref 135–145)
Total Bilirubin: 0.5 mg/dL (ref 0.2–1.2)
Total Protein: 6.2 g/dL (ref 6.0–8.3)

## 2019-04-05 LAB — CBC WITH DIFFERENTIAL/PLATELET
Basophils Absolute: 0.1 10*3/uL (ref 0.0–0.1)
Basophils Relative: 1.5 % (ref 0.0–3.0)
Eosinophils Absolute: 0.3 10*3/uL (ref 0.0–0.7)
Eosinophils Relative: 4.8 % (ref 0.0–5.0)
HCT: 40.8 % (ref 39.0–52.0)
Hemoglobin: 13.7 g/dL (ref 13.0–17.0)
Lymphocytes Relative: 26.9 % (ref 12.0–46.0)
Lymphs Abs: 1.7 10*3/uL (ref 0.7–4.0)
MCHC: 33.5 g/dL (ref 30.0–36.0)
MCV: 93.7 fl (ref 78.0–100.0)
Monocytes Absolute: 0.5 10*3/uL (ref 0.1–1.0)
Monocytes Relative: 8.5 % (ref 3.0–12.0)
Neutro Abs: 3.7 10*3/uL (ref 1.4–7.7)
Neutrophils Relative %: 58.3 % (ref 43.0–77.0)
Platelets: 232 10*3/uL (ref 150.0–400.0)
RBC: 4.35 Mil/uL (ref 4.22–5.81)
RDW: 14.4 % (ref 11.5–15.5)
WBC: 6.3 10*3/uL (ref 4.0–10.5)

## 2019-04-05 LAB — LIPID PANEL
Cholesterol: 196 mg/dL (ref 0–200)
HDL: 41.9 mg/dL (ref >39.00)
NonHDL: 153.6
Total CHOL/HDL Ratio: 5
Triglycerides: 203 mg/dL — ABNORMAL HIGH (ref 0.0–149.0)
VLDL: 40.6 mg/dL — ABNORMAL HIGH (ref 0.0–40.0)

## 2019-04-05 LAB — LDL CHOLESTEROL, DIRECT: Direct LDL: 114 mg/dL

## 2019-04-05 MED ORDER — TETANUS-DIPHTH-ACELL PERTUSSIS 5-2-15.5 LF-MCG/0.5 IM SUSP: 0.5000 mL | Freq: Once | INTRAMUSCULAR | 0 refills | Status: AC

## 2019-04-05 NOTE — Addendum Note (Signed)
Addended by: Deveron Furlong D on: 04/05/2019 08:50 AM   Modules accepted: Orders

## 2019-04-05 NOTE — Progress Notes (Signed)
Office Note 04/05/2019  CC:  Chief Complaint  Patient presents with  . Annual Exam    pt is fasting    HPI:  Matthew [name] is a 73 y.o. White male who is here for annual health maintenance exam. Feels well, no complaints. No longer taking PPI, denies any GERD/dyspepsia.   Not taking NSAIDs. Takes an iron tab "when I think of it".  He is active but no formal exercise regimen. Diet is pretty healthy.  Past Medical History:  Diagnosis Date  . Allergic rhinitis   . BRBPR (bright red blood per rectum)    Int hemorrhoids  . Family history of colon cancer in father   . Hearing impairment    Noise exposure  . History of diverticulitis of colon 03/2014   F/u CT showed resolution.  . Hyperlipidemia 03/2016   He declined statin 03/2016  . Iron deficiency anemia 03/2016; 03/2017   Hemoccults x 3 neg 05/2016.  Pt never took iron.  03/2017 iron still very low, mild anemia--hemoccults x 3 pos and I recommended pt start ferrous sulfate and arrange f/u with his GI MD.  Noncompliant with iron--saw GI MD 04/2017 and EGD showed esophagitis (o/w bx neg) likely causing his IDA.  Needs PPI bid x 52mo, then qd lifetime, plus oral iron replacement.  . Prostatitis   . Undescended left testicle    suspected to be atrophic    Past Surgical History:  Procedure Laterality Date  . COLONOSCOPY  age 24, then again 09/2014   Recall 09/2017 per [facility] records  . ESOPHAGOGASTRODUODENOSCOPY  04/2017   Dr. Kizzie Ide, likely responsible for his IDA. (otherwise, bx was NEG).  . INGUINAL HERNIA REPAIR     1 as infant, one in 1970s, 1 in 64s  . PROSTATE BIOPSY  ?2004   benign per pt report    Family History  Problem Relation Age of Onset  . Cancer Father        colon and lung    Social History   Socioeconomic History  . Marital status: Married    Spouse name: Not on file  . Number of children: Not on file  . Years of education: Not on file  . Highest education level: Not on file   Occupational History  . Not on file  Social Needs  . Financial resource strain: Not on file  . Food insecurity    Worry: Not on file    Inability: Not on file  . Transportation needs    Medical: Not on file    Non-medical: Not on file  Tobacco Use  . Smoking status: Former Research scientist (life sciences)  . Smokeless tobacco: Former Systems developer    Types: Chew  Substance and Sexual Activity  . Alcohol use: Yes  . Drug use: No  . Sexual activity: Not on file  Lifestyle  . Physical activity    Days per week: Not on file    Minutes per session: Not on file  . Stress: Not on file  Relationships  . Social Herbalist on phone: Not on file    Gets together: Not on file    Attends religious service: Not on file    Active member of club or organization: Not on file    Attends meetings of clubs or organizations: Not on file    Relationship status: Not on file  . Intimate partner violence    Fear of current or ex partner: Not on file    Emotionally abused:  Not on file    Physically abused: Not on file    Forced sexual activity: Not on file  Other Topics Concern  . Not on file  Social History Narrative   Married, 1 son.   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   No tobacco, rare alcohol, no drugs.    Outpatient Medications Prior to Visit  Medication Sig Dispense Refill  . Ferrous Sulfate Dried 200 (65 Fe) MG TABS Take by mouth 2 (two) times daily.     No facility-administered medications prior to visit.     Allergies  Allergen Reactions  . Sulfa Antibiotics Other (See Comments)    "sick"    ROS Review of Systems  Constitutional: Negative for appetite change, chills, fatigue and fever.  HENT: Negative for congestion, dental problem, ear pain and sore throat.   Eyes: Negative for discharge, redness and visual disturbance.  Respiratory: Negative for cough, chest tightness, shortness of breath and wheezing.   Cardiovascular: Negative for chest pain, palpitations and leg  swelling.  Gastrointestinal: Negative for abdominal pain, blood in stool, diarrhea, nausea and vomiting.  Genitourinary: Negative for difficulty urinating, dysuria, flank pain, frequency, hematuria and urgency.  Musculoskeletal: Negative for arthralgias, back pain, joint swelling, myalgias and neck stiffness.  Skin: Negative for pallor and rash.  Neurological: Negative for dizziness, speech difficulty, weakness and headaches.  Hematological: Negative for adenopathy. Does not bruise/bleed easily.  Psychiatric/Behavioral: Negative for confusion and sleep disturbance. The patient is not nervous/anxious.     PE; Blood pressure 130/79, pulse (!) 59, temperature 98.5 F (36.9 C), temperature source Temporal, resp. rate 16, height 5' 9.5" (1.765 m), weight 205 lb 6.4 oz (93.2 kg), SpO2 99 %. Gen: Alert, well appearing.  Patient is oriented to person, place, time, and situation. AFFECT: pleasant, lucid thought and speech. ENT: Ears: EACs clear, normal epithelium.  TMs with good light reflex and landmarks bilaterally.  Eyes: no injection, icteris, swelling, or exudate.  EOMI, PERRLA. Nose: no drainage or turbinate edema/swelling.  No injection or focal lesion.  Mouth: lips without lesion/swelling.  Oral mucosa pink and moist.  Dentition intact and without obvious caries or gingival swelling.  Oropharynx without erythema, exudate, or swelling.  Neck: supple/nontender.  No LAD, mass, or TM.  Carotid pulses 2+ bilaterally, without bruits. CV: RRR, no m/r/g.   LUNGS: CTA bilat, nonlabored resps, good aeration in all lung fields. ABD: soft, NT, ND, BS normal.  No hepatospenomegaly or mass.  No bruits. EXT: no clubbing, cyanosis, or edema.  Musculoskeletal: no joint swelling, erythema, warmth, or tenderness.  ROM of all joints intact. Skin - no sores or suspicious lesions or rashes or color changes Rectal: deferred.  Pertinent labs:  No results found for: TSH Lab Results  Component Value Date   WBC  7.7 12/13/2018   HGB 13.1 12/13/2018   HCT 39.5 12/13/2018   MCV 93.8 12/13/2018   PLT 297.0 12/13/2018   Lab Results  Component Value Date   CREATININE 0.96 12/13/2018   BUN 20 12/13/2018   NA 142 12/13/2018   K 4.6 12/13/2018   CL 107 12/13/2018   CO2 27 12/13/2018   Lab Results  Component Value Date   ALT 16 12/13/2018   AST 18 12/13/2018   ALKPHOS 46 12/13/2018   BILITOT 0.5 12/13/2018   Lab Results  Component Value Date   CHOL 226 (H) 03/31/2018   Lab Results  Component Value Date   HDL 46.90 03/31/2018   No  results found for: [facility] Lab Results  Component Value Date   TRIG 345.0 (H) 03/31/2018   Lab Results  Component Value Date   CHOLHDL 5 03/31/2018   Lab Results  Component Value Date   PSA 1.51 03/31/2018   PSA 1.66 03/30/2017   PSA 1.67 03/18/2016    ASSESSMENT AND PLAN:   1) Hx of UGI bleed + iron def anemia: he has stopped his daily PPI for about the last year, denies any symptoms, no melena, rarely takes iron. Will recheck Hb and iron panel for monitoring today.  2) Health maintenance exam: Reviewed age and gender appropriate health maintenance issues (prudent diet, regular exercise, health risks of tobacco and excessive alcohol, use of seatbelts, fire alarms in home, use of sunscreen).  Also reviewed age and gender appropriate health screening as well as vaccine recommendations. Vaccines: Tdap->will send in rx again.  Flu ->given today. Labs: CBC, CMET, FLP, iron panel, and Hep C ab. Prostate ca screening: ->discussed with pt and decided no further screening for this. Colon ca screening: ->colonoscopy was due 09/2017 (+FH of CC)-->pt has chosen to get iFOB but has not completed/mailed this in yet.  An After Visit Summary was printed and given to the patient.  FOLLOW UP:  Return in about 1 year (around 04/04/2020) for annual CPE (fasting).  Signed:  Crissie Sickles, MD           04/05/2019

## 2019-04-05 NOTE — Patient Instructions (Signed)

## 2019-04-06 ENCOUNTER — Encounter: Payer: Self-pay | Admitting: Family Medicine

## 2019-04-06 LAB — IRON,TIBC AND FERRITIN PANEL
%SAT: 20 % (calc) (ref 20–48)
Ferritin: 16 ng/mL — ABNORMAL LOW (ref 24–380)
Iron: 82 ug/dL (ref 50–180)
TIBC: 412 mcg/dL (calc) (ref 250–425)

## 2019-04-06 LAB — HEPATITIS C ANTIBODY
Hepatitis C Ab: NONREACTIVE
SIGNAL TO CUT-OFF: 0.01 (ref <1.00)

## 2019-07-04 DIAGNOSIS — H903 Sensorineural hearing loss, bilateral: Secondary | ICD-10-CM | POA: Diagnosis not present

## 2020-04-05 ENCOUNTER — Encounter: Payer: Medicare Other | Admitting: Family Medicine

## 2020-04-16 ENCOUNTER — Other Ambulatory Visit: Payer: Self-pay

## 2020-04-17 ENCOUNTER — Ambulatory Visit (INDEPENDENT_AMBULATORY_CARE_PROVIDER_SITE_OTHER): Payer: Medicare Other | Admitting: Family Medicine

## 2020-04-17 ENCOUNTER — Encounter: Payer: Self-pay | Admitting: Family Medicine

## 2020-04-17 VITALS — BP 137/79 | HR 75 | Temp 97.9°F | Resp 16 | Ht 68.5 in | Wt 205.2 lb

## 2020-04-17 DIAGNOSIS — G629 Polyneuropathy, unspecified: Secondary | ICD-10-CM | POA: Diagnosis not present

## 2020-04-17 DIAGNOSIS — Z8719 Personal history of other diseases of the digestive system: Secondary | ICD-10-CM

## 2020-04-17 DIAGNOSIS — Z862 Personal history of diseases of the blood and blood-forming organs and certain disorders involving the immune mechanism: Secondary | ICD-10-CM

## 2020-04-17 DIAGNOSIS — Z0000 Encounter for general adult medical examination without abnormal findings: Secondary | ICD-10-CM | POA: Diagnosis not present

## 2020-04-17 DIAGNOSIS — Z125 Encounter for screening for malignant neoplasm of prostate: Secondary | ICD-10-CM

## 2020-04-17 DIAGNOSIS — Z86010 Personal history of colonic polyps: Secondary | ICD-10-CM | POA: Diagnosis not present

## 2020-04-17 DIAGNOSIS — Z1211 Encounter for screening for malignant neoplasm of colon: Secondary | ICD-10-CM | POA: Diagnosis not present

## 2020-04-17 DIAGNOSIS — Z23 Encounter for immunization: Secondary | ICD-10-CM

## 2020-04-17 MED ORDER — ZOSTER VAC RECOMB ADJUVANTED 50 MCG/0.5ML IM SUSR: 0.5000 mL | Freq: Once | INTRAMUSCULAR | 0 refills | Status: AC

## 2020-04-17 MED ORDER — TETANUS-DIPHTH-ACELL PERTUSSIS 5-2-15.5 LF-MCG/0.5 IM SUSP: 0.5000 mL | Freq: Once | INTRAMUSCULAR | 0 refills | Status: AC

## 2020-04-17 NOTE — Progress Notes (Signed)
Office Note 04/17/2020  CC:  Chief Complaint  Patient presents with  . Annual Exam    pt is not fasting    HPI:  Matthew [name] is a 74 y.o. White male who is here for annual health maintenance exam and f/u hx of iron def anemia and HLD.  I last saw him 1 yr ago for same. A/P as of that visit: "1) Hx of UGI bleed + iron def anemia: he has stopped his daily PPI for about the last year, denies any symptoms, no melena, rarely takes iron. Will recheck Hb and iron panel for monitoring today.  2) Health maintenance exam: Reviewed age and gender appropriate health maintenance issues (prudent diet, regular exercise, health risks of tobacco and excessive alcohol, use of seatbelts, fire alarms in home, use of sunscreen).  Also reviewed age and gender appropriate health screening as well as vaccine recommendations. Vaccines: Tdap->will send in rx again.  Flu ->given today. Labs: CBC, CMET, FLP, iron panel, and Hep C ab. Prostate ca screening: ->discussed with pt and decided no further screening for this. Colon ca screening: ->colonoscopy was due 09/2017 (+FH of CC)-->pt has chosen to get iFOB but has not completed/mailed this in yet."  INTERIM HX: Feeling fine. 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 Says he eats fairly healthy, is active all day every day but no formal exercise.  No abd pain, no melena or hematochezia. Takes 1 iron tab "most days".  Reports mild tingling at ends of toes since he had iron def, has improved since getting on iron replacement more consistently.  Past Medical History:  Diagnosis Date  . Allergic rhinitis   . BRBPR (bright red blood per rectum)    Int hemorrhoids  . Family history of colon cancer in father   . Hearing impairment    Noise exposure  . History of diverticulitis of colon 03/2014   F/u CT showed resolution.  . Hyperlipidemia 03/2016   He declined statin 03/2016  . Iron deficiency anemia 03/2016; 03/2017    Hemoccults x 3 neg 05/2016.  Pt never took iron.  03/2017 iron still very low, mild anemia--hemoccults x 3 pos and I recommended pt start ferrous sulfate and arrange f/u with his GI MD.  Noncompliant with iron--saw GI MD 04/2017 and EGD showed esophagitis (o/w bx neg) likely causing his IDA.  Needs PPI bid x 83mo, then qd lifetime, plus oral iron replacement. iron still low 04/2019 b/c noncomplianc  . Prostatitis   . Undescended left testicle    suspected to be atrophic    Past Surgical History:  Procedure Laterality Date  . COLONOSCOPY  age 40, then again 09/2014   Recall 09/2017 per [place] records  . ESOPHAGOGASTRODUODENOSCOPY  04/2017   Dr. Kizzie Ide, likely responsible for his IDA. (otherwise, bx was NEG).  . INGUINAL HERNIA REPAIR     1 as infant, one in 1970s, 1 in 70s  . PROSTATE BIOPSY  ?2004   benign per pt report    Family History  Problem Relation Age of Onset  . Cancer Father        colon and lung    Social History   Socioeconomic History  . Marital status: Married    Spouse name: Not on file  . Number of children: Not on file  . Years of education: Not on file  . Highest education level: Not on file  Occupational History  . Not on file  Tobacco Use  .  Smoking status: Former Research scientist (life sciences)  . Smokeless tobacco: Former Systems developer    Types: Chew  Substance and Sexual Activity  . Alcohol use: Yes  . Drug use: No  . Sexual activity: Not on file  Other Topics Concern  . Not on file  Social History Narrative   Married, 1 son.   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   No tobacco, rare alcohol, no drugs.   Social Determinants of Health   Financial Resource Strain:   . Difficulty of Paying Living Expenses: Not on file  Food Insecurity:   . Worried About Charity fundraiser in the Last Year: Not on file  . Ran Out of Food in the Last Year: Not on file  Transportation Needs:   . Lack of Transportation (Medical): Not on file  . Lack of  Transportation (Non-Medical): Not on file  Physical Activity:   . Days of Exercise per Week: Not on file  . Minutes of Exercise per Session: Not on file  Stress:   . Feeling of Stress : Not on file  Social Connections:   . Frequency of Communication with Friends and Family: Not on file  . Frequency of Social Gatherings with Friends and Family: Not on file  . Attends Religious Services: Not on file  . Active Member of Clubs or Organizations: Not on file  . Attends Archivist Meetings: Not on file  . Marital Status: Not on file  Intimate Partner Violence:   . Fear of Current or Ex-Partner: Not on file  . Emotionally Abused: Not on file  . Physically Abused: Not on file  . Sexually Abused: Not on file    Outpatient Medications Prior to Visit  Medication Sig Dispense Refill  . Ferrous Sulfate Dried 200 (65 Fe) MG TABS Take by mouth 2 (two) times daily.     No facility-administered medications prior to visit.    Allergies  Allergen Reactions  . Sulfa Antibiotics Other (See Comments)    "sick"    ROS Review of Systems  Constitutional: Negative for appetite change, chills, fatigue and fever.  HENT: Negative for congestion, dental problem, ear pain and sore throat.   Eyes: Negative for discharge, redness and visual disturbance.  Respiratory: Negative for cough, chest tightness, shortness of breath and wheezing.   Cardiovascular: Negative for chest pain, palpitations and leg swelling.  Gastrointestinal: Negative for abdominal pain, blood in stool, diarrhea, nausea and vomiting.  Genitourinary: Negative for difficulty urinating, dysuria, flank pain, frequency, hematuria and urgency.  Musculoskeletal: Negative for arthralgias, back pain, joint swelling, myalgias and neck stiffness.  Skin: Negative for pallor and rash.  Neurological: Negative for dizziness, speech difficulty, weakness and headaches.  Hematological: Negative for adenopathy. Does not bruise/bleed easily.   Psychiatric/Behavioral: Negative for confusion and sleep disturbance. The patient is not nervous/anxious.     PE; Vitals with BMI 04/17/2020 04/05/2019 12/13/2018  Height 5' 8.5" 5' 9.5" 5' 9.5"  Weight 205 lbs 3 oz 205 lbs 6 oz 196 lbs 13 oz  BMI 30.74 96.78 93.81  Systolic 017 510 258  Diastolic 79 79 66  Pulse 75 59 58   Gen: Alert, well appearing.  Patient is oriented to person, place, time, and situation. AFFECT: pleasant, lucid thought and speech. ENT: Ears: EACs clear, normal epithelium.  TMs with good light reflex and landmarks bilaterally.  Eyes: no injection, icteris, swelling, or exudate.  EOMI, PERRLA. Nose: no drainage or turbinate edema/swelling.  No injection or focal  lesion.  Mouth: lips without lesion/swelling.  Oral mucosa pink and moist.  Dentition intact and without obvious caries or gingival swelling.  Oropharynx without erythema, exudate, or swelling.  Neck: supple/nontender.  No LAD, mass, or TM.  Carotid pulses 2+ bilaterally, without bruits. CV: RRR, no m/r/g.   LUNGS: CTA bilat, nonlabored resps, good aeration in all lung fields. ABD: soft, NT, ND, BS normal.  No hepatospenomegaly or mass.  No bruits. EXT: no clubbing, cyanosis, or edema.  Musculoskeletal: no joint swelling, erythema, warmth, or tenderness.  ROM of all joints intact. Skin - no sores or suspicious lesions or rashes or color changes   Pertinent labs:   Lab Results  Component Value Date   WBC 6.3 04/05/2019   HGB 13.7 04/05/2019   HCT 40.8 04/05/2019   MCV 93.7 04/05/2019   PLT 232.0 04/05/2019   Lab Results  Component Value Date   IRON 82 04/05/2019   TIBC 412 04/05/2019   FERRITIN 16 (L) 04/05/2019    Lab Results  Component Value Date   CREATININE 1.10 04/05/2019   BUN 22 04/05/2019   NA 141 04/05/2019   K 4.5 04/05/2019   CL 107 04/05/2019   CO2 27 04/05/2019   Lab Results  Component Value Date   ALT 9 04/05/2019   AST 16 04/05/2019   ALKPHOS 55 04/05/2019   BILITOT  0.5 04/05/2019   Lab Results  Component Value Date   CHOL 196 04/05/2019   Lab Results  Component Value Date   HDL 41.90 04/05/2019   No results found for: [facility] Lab Results  Component Value Date   TRIG 203.0 (H) 04/05/2019   Lab Results  Component Value Date   CHOLHDL 5 04/05/2019   Lab Results  Component Value Date   PSA 1.51 03/31/2018   PSA 1.66 03/30/2017   PSA 1.67 03/18/2016   ASSESSMENT AND PLAN:   1) Hx of IDA secondary to chronic upper GI blood loss. Taking iron tab daily. CBC, iron panel today.  2) Health maintenance exam: Reviewed age and gender appropriate health maintenance issues (prudent diet, regular exercise, health risks of tobacco and excessive alcohol, use of seatbelts, fire alarms in home, use of sunscreen).  Also reviewed age and gender appropriate health screening as well as vaccine recommendations. Vaccines: Covid 19->UTD.  Flu->declined today.  Tdap->sent to pharmacy.  Shingrix->sent to pharmacy. Labs: lipids, cmet, cbc, iron panel ordered.  Not fasting: ate a sandwich 3 hrs ago.  He prefers to get labs while here today instead of returning for fasting lab appt. Prostate ca screening: he has elected the last couple years to do no further prostate ca screening but wife wants him to get a PSA again today.   Colon ca screening:  Has hx of adenomatous polyps, [facility] approx 4-5 yrs ago->gave pt their contact info today so he could arrange f/u colonoscopy.  3) Tingling in toes: ? From iron def-->has improved signif since getting on regular oral iron supplement. Recheck iron and also check vit B12 level today.  An After Visit Summary was printed and given to the patient.  FOLLOW UP:  No follow-ups on file.  Signed:  Crissie Sickles, MD           04/17/2020

## 2020-04-17 NOTE — Patient Instructions (Addendum)
Call [facility] to make appt for colonoscopy with Dr. Ferdinand Lango: ph [phone]    Health Maintenance, Male Adopting a healthy lifestyle and getting preventive care are important in promoting health and wellness. Ask your health care provider about:  The right schedule for you to have regular tests and exams.  Things you can do on your own to prevent diseases and keep yourself healthy. What should I know about diet, weight, and exercise? Eat a healthy diet   Eat a diet that includes plenty of vegetables, fruits, low-fat dairy products, and lean protein.  Do not eat a lot of foods that are high in solid fats, added sugars, or sodium. Maintain a healthy weight Body mass index (BMI) is a measurement that can be used to identify possible weight problems. It estimates body fat based on height and weight. Your health care provider can help determine your BMI and help you achieve or maintain a healthy weight. Get regular exercise Get regular exercise. This is one of the most important things you can do for your health. Most adults should:  Exercise for at least 150 minutes each week. The exercise should increase your heart rate and make you sweat (moderate-intensity exercise).  Do strengthening exercises at least twice a week. This is in addition to the moderate-intensity exercise.  Spend less time sitting. Even light physical activity can be beneficial. Watch cholesterol and blood lipids Have your blood tested for lipids and cholesterol at 75 years of age, then have this test every 5 years. You may need to have your cholesterol levels checked more often if:  Your lipid or cholesterol levels are high.  You are older than 75 years of age.  You are at high risk for heart disease. What should I know about cancer screening? Many types of cancers can be detected early and may often be prevented. Depending on your health history and family history, you may need to have cancer  screening at various ages. This may include screening for:  Colorectal cancer.  Prostate cancer.  Skin cancer.  Lung cancer. What should I know about heart disease, diabetes, and high blood pressure? Blood pressure and heart disease  High blood pressure causes heart disease and increases the risk of stroke. This is more likely to develop in people who have high blood pressure readings, are of African descent, or are overweight.  Talk with your health care provider about your target blood pressure readings.  Have your blood pressure checked: ? Every 3-5 years if you are 21-37 years of age. ? Every year if you are 67 years old or older.  If you are between the ages of 72 and 35 and are a current or former smoker, ask your health care provider if you should have a one-time screening for abdominal aortic aneurysm (AAA). Diabetes Have regular diabetes screenings. This checks your fasting blood sugar level. Have the screening done:  Once every three years after age 64 if you are at a normal weight and have a low risk for diabetes.  More often and at a younger age if you are overweight or have a high risk for diabetes. What should I know about preventing infection? Hepatitis B If you have a higher risk for hepatitis B, you should be screened for this virus. Talk with your health care provider to find out if you are at risk for hepatitis B infection. Hepatitis C Blood testing is recommended for:  Everyone born from [birth year] through 1966.  Anyone with known  risk factors for hepatitis C. Sexually transmitted infections (STIs)  You should be screened each year for STIs, including gonorrhea and chlamydia, if: ? You are sexually active and are younger than 75 years of age. ? You are older than 75 years of age and your health care provider tells you that you are at risk for this type of infection. ? Your sexual activity has changed since you were last screened, and you are at increased risk  for chlamydia or gonorrhea. Ask your health care provider if you are at risk.  Ask your health care provider about whether you are at high risk for HIV. Your health care provider may recommend a prescription medicine to help prevent HIV infection. If you choose to take medicine to prevent HIV, you should first get tested for HIV. You should then be tested every 3 months for as long as you are taking the medicine. Follow these instructions at home: Lifestyle  Do not use any products that contain nicotine or tobacco, such as cigarettes, e-cigarettes, and chewing tobacco. If you need help quitting, ask your health care provider.  Do not use street drugs.  Do not share needles.  Ask your health care provider for help if you need support or information about quitting drugs. Alcohol use  Do not drink alcohol if your health care provider tells you not to drink.  If you drink alcohol: ? Limit how much you have to 0-2 drinks a day. ? Be aware of how much alcohol is in your drink. In the [place] one drink equals one 12 oz bottle of beer (355 mL), one 5 oz glass of wine (148 mL), or one 1 oz glass of hard liquor (44 mL). General instructions  Schedule regular health, dental, and eye exams.  Stay current with your vaccines.  Tell your health care provider if: ? You often feel depressed. ? You have ever been abused or do not feel safe at home. Summary  Adopting a healthy lifestyle and getting preventive care are important in promoting health and wellness.  Follow your health care provider's instructions about healthy diet, exercising, and getting tested or screened for diseases.  Follow your health care provider's instructions on monitoring your cholesterol and blood pressure. This information is not intended to replace advice given to you by your health care provider. Make sure you discuss any questions you have with your health care provider. Document Revised: 05/11/2018 Document Reviewed:  05/11/2018 Elsevier Patient Education  2020 [employer].

## 2020-04-17 NOTE — Addendum Note (Signed)
Addended by: Deveron Furlong D on: 04/17/2020 03:17 PM   Modules accepted: Orders

## 2020-04-18 ENCOUNTER — Encounter: Payer: Self-pay | Admitting: Family Medicine

## 2020-04-18 LAB — COMPREHENSIVE METABOLIC PANEL
ALT: 12 U/L (ref 0–53)
AST: 18 U/L (ref 0–37)
Albumin: 4.2 g/dL (ref 3.5–5.2)
Alkaline Phosphatase: 50 U/L (ref 39–117)
BUN: 24 mg/dL — ABNORMAL HIGH (ref 6–23)
CO2: 27 mEq/L (ref 19–32)
Calcium: 9.2 mg/dL (ref 8.4–10.5)
Chloride: 106 mEq/L (ref 96–112)
Creatinine, Ser: 1.14 mg/dL (ref 0.40–1.50)
GFR: 62.91 mL/min (ref >60.00)
Glucose, Bld: 83 mg/dL (ref 70–99)
Potassium: 3.8 mEq/L (ref 3.5–5.1)
Sodium: 141 mEq/L (ref 135–145)
Total Bilirubin: 0.4 mg/dL (ref 0.2–1.2)
Total Protein: 6.1 g/dL (ref 6.0–8.3)

## 2020-04-18 LAB — IRON,TIBC AND FERRITIN PANEL
%SAT: 32 % (calc) (ref 20–48)
Ferritin: 34 ng/mL (ref 24–380)
Iron: 133 ug/dL (ref 50–180)
TIBC: 419 mcg/dL (calc) (ref 250–425)

## 2020-04-18 LAB — CBC WITH DIFFERENTIAL/PLATELET
Basophils Absolute: 0 10*3/uL (ref 0.0–0.1)
Basophils Relative: 0.5 % (ref 0.0–3.0)
Eosinophils Absolute: 0.4 10*3/uL (ref 0.0–0.7)
Eosinophils Relative: 5.2 % — ABNORMAL HIGH (ref 0.0–5.0)
HCT: 41.5 % (ref 39.0–52.0)
Hemoglobin: 14.1 g/dL (ref 13.0–17.0)
Lymphocytes Relative: 24.6 % (ref 12.0–46.0)
Lymphs Abs: 1.9 10*3/uL (ref 0.7–4.0)
MCHC: 34.1 g/dL (ref 30.0–36.0)
MCV: 92.8 fl (ref 78.0–100.0)
Monocytes Absolute: 0.8 10*3/uL (ref 0.1–1.0)
Monocytes Relative: 10.3 % (ref 3.0–12.0)
Neutro Abs: 4.5 10*3/uL (ref 1.4–7.7)
Neutrophils Relative %: 59.4 % (ref 43.0–77.0)
Platelets: 218 10*3/uL (ref 150.0–400.0)
RBC: 4.47 Mil/uL (ref 4.22–5.81)
RDW: 14.7 % (ref 11.5–15.5)
WBC: 7.6 10*3/uL (ref 4.0–10.5)

## 2020-04-18 LAB — VITAMIN B12: Vitamin B-12: 396 pg/mL (ref 211–911)

## 2020-04-18 LAB — LIPID PANEL
Cholesterol: 200 mg/dL (ref 0–200)
HDL: 41.6 mg/dL (ref >39.00)
Total CHOL/HDL Ratio: 5
Triglycerides: 404 mg/dL — ABNORMAL HIGH (ref 0.0–149.0)

## 2020-04-18 LAB — PSA, MEDICARE: PSA: 1.65 ng/ml (ref 0.10–4.00)

## 2020-04-18 LAB — LDL CHOLESTEROL, DIRECT: Direct LDL: 118 mg/dL

## 2020-04-19 ENCOUNTER — Telehealth: Payer: Self-pay

## 2020-04-19 DIAGNOSIS — E7800 Pure hypercholesterolemia, unspecified: Secondary | ICD-10-CM

## 2020-04-19 MED ORDER — FENOFIBRATE 160 MG PO TABS: 160.0000 mg | ORAL_TABLET | Freq: Every day | ORAL | 2 refills | Status: DC

## 2020-04-19 NOTE — Telephone Encounter (Signed)
-----   Message from Tammi Sou, MD sent at 04/18/2020  7:27 PM EST ----- All labs normal except his triglycerides are moderately elevated. His level of elevation was beyond even what I would expect for a non-fasting blood sample. I recommend he start a medication to lower the triglycerides b/c if left untreated he runs risk of this causing severe inflammation of his pancreas.  If pt agreeable, pls eRx fenofibrate 160mg , 1 tab po qd, #30, RF x 2. Fasting lab appt for lipid panel and AST and ALT in 2-3 mo, dx is hypertriglyceridemia.-thx

## 2020-04-19 NOTE — Telephone Encounter (Signed)
Spoke with pt regarding labs and instructions. Rx sent. appt sched

## 2020-07-19 ENCOUNTER — Other Ambulatory Visit: Payer: Self-pay

## 2020-07-22 ENCOUNTER — Encounter: Payer: Self-pay | Admitting: Family Medicine

## 2020-07-22 ENCOUNTER — Ambulatory Visit (INDEPENDENT_AMBULATORY_CARE_PROVIDER_SITE_OTHER): Payer: Medicare Other | Admitting: Family Medicine

## 2020-07-22 ENCOUNTER — Other Ambulatory Visit: Payer: Self-pay

## 2020-07-22 VITALS — BP 119/74 | HR 52 | Temp 97.6°F | Resp 16 | Ht 68.5 in | Wt 204.0 lb

## 2020-07-22 DIAGNOSIS — E781 Pure hyperglyceridemia: Secondary | ICD-10-CM | POA: Diagnosis not present

## 2020-07-22 LAB — HEPATIC FUNCTION PANEL
ALT: 9 U/L (ref 0–53)
AST: 15 U/L (ref 0–37)
Albumin: 4.2 g/dL (ref 3.5–5.2)
Alkaline Phosphatase: 50 U/L (ref 39–117)
Bilirubin, Direct: 0.1 mg/dL (ref 0.0–0.3)
Total Bilirubin: 0.5 mg/dL (ref 0.2–1.2)
Total Protein: 6.2 g/dL (ref 6.0–8.3)

## 2020-07-22 LAB — LIPID PANEL
Cholesterol: 209 mg/dL — ABNORMAL HIGH (ref 0–200)
HDL: 47.3 mg/dL (ref >39.00)
LDL Cholesterol: 126 mg/dL — ABNORMAL HIGH (ref 0–99)
NonHDL: 161.28
Total CHOL/HDL Ratio: 4
Triglycerides: 175 mg/dL — ABNORMAL HIGH (ref 0.0–149.0)
VLDL: 35 mg/dL (ref 0.0–40.0)

## 2020-07-22 NOTE — Progress Notes (Signed)
OFFICE VISIT  07/22/2020  CC:  Chief Complaint  Matthew [name] presents with  . Follow-up    [facility], pt is fasting   HPI:    Matthew [name] is a 75 y.o. Caucasian male who presents for 3 mo f/u hypertriglyceridemia. A/P as of last visit: "1) Hx of IDA secondary to chronic upper GI blood loss. Taking iron tab daily. CBC, iron panel today.  2) Health maintenance exam: Reviewed age and gender appropriate health maintenance issues (prudent diet, regular exercise, health risks of tobacco and excessive alcohol, use of seatbelts, fire alarms in home, use of sunscreen).  Also reviewed age and gender appropriate health screening as well as vaccine recommendations. Vaccines: Covid 19->UTD.  Flu->declined today.  Tdap->sent to pharmacy.  Shingrix->sent to pharmacy. Labs: lipids, cmet, cbc, iron panel ordered.  Not fasting: ate a sandwich 3 hrs ago.  He prefers to get labs while here today instead of returning for fasting lab appt. Prostate ca screening: he has elected the last couple years to do no further prostate ca screening but wife wants him to get a PSA again today.   Colon ca screening:  Has hx of adenomatous polyps, [facility] approx 4-5 yrs ago->gave pt their contact info today so he could arrange f/u colonoscopy.  3) Tingling in toes: ? From iron def-->has improved signif since getting on regular oral iron supplement. Recheck iron and also check vit B12 level today."  INTERIM HX: Feeling fine. Triglycerides were signif elevated last visit so I recommended fenofibrate 160mg  qd. He decided to NOT start the med b/c wanted to focus on diet more-->less red meat, less dairy. No change in activity level.  He last ate >12 hours ago.  Past Medical History:  Diagnosis Date  . Allergic rhinitis   . BRBPR (bright red blood per rectum)    Int hemorrhoids  . Family history of colon cancer in father   . Hearing impairment    Noise exposure  . History of diverticulitis of colon 03/2014   F/u CT  showed resolution.  . Hyperlipemia, mixed 03/2016   He declined statin 03/2016.  Mod high trigs 04/2020, recommended fenofibrate.  . Iron deficiency anemia 03/2016; 03/2017   Hemoccults x 3 neg 05/2016.  Pt never took iron.  03/2017 iron still very low, mild anemia--hemoccults x 3 pos and I recommended pt start ferrous sulfate and arrange f/u with his GI MD.  Noncompliant with iron--saw GI MD 04/2017 and EGD showed esophagitis (o/w bx neg) likely causing his IDA.  Needs PPI bid x 70mo, then qd lifetime, plus oral iron replacement. iron still low 04/2019 b/c noncomplianc  . Prostatitis   . Undescended left testicle    suspected to be atrophic    Past Surgical History:  Procedure Laterality Date  . COLONOSCOPY  age 57, then again 09/2014   Recall 09/2017 per [facility] records  . ESOPHAGOGASTRODUODENOSCOPY  04/2017   Dr. Kizzie Ide, likely responsible for his IDA. (otherwise, bx was NEG).  . INGUINAL HERNIA REPAIR     1 as infant, one in 1970s, 1 in 60s  . PROSTATE BIOPSY  ?2004   benign per pt report    Outpatient Medications Prior to Visit  Medication Sig Dispense Refill  . Ferrous Sulfate Dried 200 (65 Fe) MG TABS Take by mouth 2 (two) times daily.    . fenofibrate 160 MG tablet Take 1 tablet (160 mg total) by mouth daily. (Matthew [name] not taking: Reported on 07/22/2020) 30 tablet 2   No facility-administered medications prior  to visit.    Allergies  Allergen Reactions  . Sulfa Antibiotics Other (See Comments)    "sick"    ROS As per HPI  PE: Vitals with BMI 07/22/2020 04/17/2020 04/05/2019  Height 5' 8.5" 5' 8.5" 5' 9.5"  Weight 204 lbs 205 lbs 3 oz 205 lbs 6 oz  BMI 30.56 30.16 01.09  Systolic 323 557 322  Diastolic 74 79 79  Pulse 52 75 59     Gen: Alert, well appearing.  Matthew [name] is oriented to person, place, time, and situation. AFFECT: pleasant, lucid thought and speech. No further exam today.  LABS:  No results found for: [facility] Lab Results  Component Value  Date   WBC 7.6 04/17/2020   HGB 14.1 04/17/2020   HCT 41.5 04/17/2020   MCV 92.8 04/17/2020   PLT 218.0 04/17/2020   Lab Results  Component Value Date   CREATININE 1.14 04/17/2020   BUN 24 (H) 04/17/2020   NA 141 04/17/2020   K 3.8 04/17/2020   CL 106 04/17/2020   CO2 27 04/17/2020   Lab Results  Component Value Date   ALT 12 04/17/2020   AST 18 04/17/2020   ALKPHOS 50 04/17/2020   BILITOT 0.4 04/17/2020   Lab Results  Component Value Date   CHOL 200 04/17/2020   Lab Results  Component Value Date   HDL 41.60 04/17/2020   No results found for: [facility] Lab Results  Component Value Date   TRIG (H) 04/17/2020    404.0 Triglyceride is over 400; calculations on Lipids are invalid.   Lab Results  Component Value Date   CHOLHDL 5 04/17/2020   Lab Results  Component Value Date   PSA 1.65 04/17/2020   PSA 1.51 03/31/2018   PSA 1.66 03/30/2017   Lab Results  Component Value Date   VITAMINB12 396 04/17/2020   Lab Results  Component Value Date   IRON 133 04/17/2020   TIBC 419 04/17/2020   FERRITIN 34 04/17/2020    IMPRESSION AND PLAN:  Hypertriglyceridemia. Pt declined med in favor of 3 mo trial of improved diet. Recheck lipids and hepatic panel today. If trigs still >400 I recommend he start the fibrate.  An After Visit Summary was printed and given to the Matthew [name].  FOLLOW UP: Return in about 9 months (around 04/21/2021) for annual CPE (fasting).  Signed:  Crissie Sickles, MD           07/22/2020

## 2020-07-23 ENCOUNTER — Encounter: Payer: Self-pay | Admitting: Family Medicine

## 2020-10-10 ENCOUNTER — Ambulatory Visit: Payer: Medicare Other | Admitting: Dermatology

## 2020-10-10 ENCOUNTER — Other Ambulatory Visit: Payer: Self-pay

## 2020-10-10 ENCOUNTER — Encounter: Payer: Self-pay | Admitting: Dermatology

## 2020-10-10 DIAGNOSIS — L578 Other skin changes due to chronic exposure to nonionizing radiation: Secondary | ICD-10-CM | POA: Diagnosis not present

## 2020-10-10 DIAGNOSIS — L820 Inflamed seborrheic keratosis: Secondary | ICD-10-CM

## 2020-10-10 DIAGNOSIS — L814 Other melanin hyperpigmentation: Secondary | ICD-10-CM | POA: Diagnosis not present

## 2020-10-10 DIAGNOSIS — D1800 Hemangioma unspecified site: Secondary | ICD-10-CM

## 2020-10-10 DIAGNOSIS — Z85828 Personal history of other malignant neoplasm of skin: Secondary | ICD-10-CM

## 2020-10-10 DIAGNOSIS — L570 Actinic keratosis: Secondary | ICD-10-CM

## 2020-10-10 DIAGNOSIS — Z1283 Encounter for screening for malignant neoplasm of skin: Secondary | ICD-10-CM

## 2020-10-10 DIAGNOSIS — L821 Other seborrheic keratosis: Secondary | ICD-10-CM

## 2020-10-10 DIAGNOSIS — D229 Melanocytic nevi, unspecified: Secondary | ICD-10-CM

## 2020-10-10 NOTE — Progress Notes (Signed)
New Patient Visit  Subjective  Matthew [name] is a 75 y.o. male who presents for the following: Total body skin exam (Hx of BCC R temple, hx of AKs). The patient presents for Total-Body Skin Exam (TBSE) for skin cancer screening and mole check.  The following portions of the chart were reviewed this encounter and updated as appropriate:   Tobacco  Allergies  Meds  Problems  Med Hx  Surg Hx  Fam Hx     Review of Systems:  No other skin or systemic complaints except as noted in HPI or Assessment and Plan.  Objective  Well appearing patient in no apparent distress; mood and affect are within normal limits.  A full examination was performed including scalp, head, eyes, ears, nose, lips, neck, chest, axillae, abdomen, back, buttocks, bilateral upper extremities, bilateral lower extremities, hands, feet, fingers, toes, fingernails, and toenails. All findings within normal limits unless otherwise noted below.  Objective  R temple: Well healed scar with no evidence of recurrence.   Objective  L forearm x 1, L dorsum hand x 1 (2): Erythematous keratotic or waxy stuck-on papule or plaque.   Objective  bil ears x 9 (9): Pink scaly macules    Assessment & Plan    Lentigines - Scattered tan macules - Due to sun exposure - Benign-appering, observe - Recommend daily broad spectrum sunscreen SPF 30+ to sun-exposed areas, reapply every 2 hours as needed. - Call for any changes  Seborrheic Keratoses - Stuck-on, waxy, tan-brown papules and/or plaques  - Benign-appearing - Discussed benign etiology and prognosis. - Observe - Call for any changes  Melanocytic Nevi - Tan-brown and/or pink-flesh-colored symmetric macules and papules - Benign appearing on exam today - Observation - Call clinic for new or changing moles - Recommend daily use of broad spectrum spf 30+ sunscreen to sun-exposed areas.   Hemangiomas - Red papules - Discussed benign nature - Observe - Call for  any changes  Actinic Damage - Chronic condition, secondary to cumulative UV/sun exposure - diffuse scaly erythematous macules with underlying dyspigmentation - Recommend daily broad spectrum sunscreen SPF 30+ to sun-exposed areas, reapply every 2 hours as needed.  - Staying in the shade or wearing long sleeves, sun glasses (UVA+UVB protection) and wide brim hats (4-inch brim around the entire circumference of the hat) are also recommended for sun protection.  - Call for new or changing lesions.  Skin cancer screening performed today.  History of basal cell carcinoma (BCC) R temple Clear. Observe for recurrence. Call clinic for new or changing lesions.  Recommend regular skin exams, daily broad-spectrum spf 30+ sunscreen use, and photoprotection.     Inflamed seborrheic keratosis (2) L forearm x 1, L dorsum hand x 1 Destruction of lesion - L forearm x 1, L dorsum hand x 1 Complexity: simple   Destruction method: cryotherapy   Informed consent: discussed and consent obtained   Timeout:  patient name, date of birth, surgical site, and procedure verified Lesion destroyed using liquid nitrogen: Yes   Region frozen until ice ball extended beyond lesion: Yes   Outcome: patient tolerated procedure well with no complications   Post-procedure details: wound care instructions given    AK (actinic keratosis) (9) bil ears x 9 Destruction of lesion - bil ears x 9 Complexity: simple   Destruction method: cryotherapy   Informed consent: discussed and consent obtained   Timeout:  patient name, date of birth, surgical site, and procedure verified Lesion destroyed using liquid nitrogen: Yes  Region frozen until ice ball extended beyond lesion: Yes   Outcome: patient tolerated procedure well with no complications   Post-procedure details: wound care instructions given    Return in about 1 year (around 10/10/2021) for TBSE, Hx of BCC, Hx of AKs.  I, Othelia Pulling, RMA, am acting as scribe for  Sarina Ser, MD .  Documentation: I have reviewed the above documentation for accuracy and completeness, and I agree with the above.  Sarina Ser, MD

## 2020-10-10 NOTE — Patient Instructions (Signed)

## 2020-10-15 ENCOUNTER — Encounter: Payer: Self-pay | Admitting: Dermatology

## 2021-01-15 ENCOUNTER — Telehealth: Payer: Self-pay | Admitting: Family Medicine

## 2021-01-15 NOTE — Telephone Encounter (Signed)
Left message for patient to schedule Annual Wellness Visit.  Please schedule with Nurse Health Advisor Julie Greer, RN at [facility]. Please call [phone] ask for Kathy  

## 2021-04-18 ENCOUNTER — Encounter: Payer: Self-pay | Admitting: Family Medicine

## 2021-04-18 ENCOUNTER — Ambulatory Visit (INDEPENDENT_AMBULATORY_CARE_PROVIDER_SITE_OTHER): Payer: Medicare Other | Admitting: Family Medicine

## 2021-04-18 ENCOUNTER — Other Ambulatory Visit: Payer: Self-pay

## 2021-04-18 VITALS — BP 131/79 | HR 51 | Temp 97.6°F | Ht 69.75 in | Wt 205.4 lb

## 2021-04-18 DIAGNOSIS — Z1211 Encounter for screening for malignant neoplasm of colon: Secondary | ICD-10-CM

## 2021-04-18 DIAGNOSIS — E782 Mixed hyperlipidemia: Secondary | ICD-10-CM | POA: Diagnosis not present

## 2021-04-18 DIAGNOSIS — Z0000 Encounter for general adult medical examination without abnormal findings: Secondary | ICD-10-CM

## 2021-04-18 DIAGNOSIS — Z23 Encounter for immunization: Secondary | ICD-10-CM

## 2021-04-18 DIAGNOSIS — Z125 Encounter for screening for malignant neoplasm of prostate: Secondary | ICD-10-CM

## 2021-04-18 DIAGNOSIS — Z862 Personal history of diseases of the blood and blood-forming organs and certain disorders involving the immune mechanism: Secondary | ICD-10-CM

## 2021-04-18 LAB — CBC WITH DIFFERENTIAL/PLATELET
Basophils Absolute: 0 10*3/uL (ref 0.0–0.1)
Basophils Relative: 0.8 % (ref 0.0–3.0)
Eosinophils Absolute: 0.3 10*3/uL (ref 0.0–0.7)
Eosinophils Relative: 6.6 % — ABNORMAL HIGH (ref 0.0–5.0)
HCT: 42.4 % (ref 39.0–52.0)
Hemoglobin: 14.3 g/dL (ref 13.0–17.0)
Lymphocytes Relative: 29.2 % (ref 12.0–46.0)
Lymphs Abs: 1.5 10*3/uL (ref 0.7–4.0)
MCHC: 33.8 g/dL (ref 30.0–36.0)
MCV: 92.4 fl (ref 78.0–100.0)
Monocytes Absolute: 0.6 10*3/uL (ref 0.1–1.0)
Monocytes Relative: 11.4 % (ref 3.0–12.0)
Neutro Abs: 2.6 10*3/uL (ref 1.4–7.7)
Neutrophils Relative %: 52 % (ref 43.0–77.0)
Platelets: 214 10*3/uL (ref 150.0–400.0)
RBC: 4.59 Mil/uL (ref 4.22–5.81)
RDW: 13.8 % (ref 11.5–15.5)
WBC: 5 10*3/uL (ref 4.0–10.5)

## 2021-04-18 LAB — COMPREHENSIVE METABOLIC PANEL
ALT: 10 U/L (ref 0–53)
AST: 15 U/L (ref 0–37)
Albumin: 4.4 g/dL (ref 3.5–5.2)
Alkaline Phosphatase: 49 U/L (ref 39–117)
BUN: 22 mg/dL (ref 6–23)
CO2: 28 mEq/L (ref 19–32)
Calcium: 9.5 mg/dL (ref 8.4–10.5)
Chloride: 104 mEq/L (ref 96–112)
Creatinine, Ser: 0.99 mg/dL (ref 0.40–1.50)
GFR: 73.99 mL/min (ref >60.00)
Glucose, Bld: 92 mg/dL (ref 70–99)
Potassium: 4.4 mEq/L (ref 3.5–5.1)
Sodium: 140 mEq/L (ref 135–145)
Total Bilirubin: 0.5 mg/dL (ref 0.2–1.2)
Total Protein: 6.3 g/dL (ref 6.0–8.3)

## 2021-04-18 LAB — LIPID PANEL
Cholesterol: 217 mg/dL — ABNORMAL HIGH (ref 0–200)
HDL: 45.9 mg/dL (ref >39.00)
NonHDL: 170.96
Total CHOL/HDL Ratio: 5
Triglycerides: 203 mg/dL — ABNORMAL HIGH (ref 0.0–149.0)
VLDL: 40.6 mg/dL — ABNORMAL HIGH (ref 0.0–40.0)

## 2021-04-18 LAB — LDL CHOLESTEROL, DIRECT: Direct LDL: 138 mg/dL

## 2021-04-18 LAB — PSA, MEDICARE: PSA: 2.58 ng/ml (ref 0.10–4.00)

## 2021-04-18 MED ORDER — TETANUS-DIPHTH-ACELL PERTUSSIS 5-2-15.5 LF-MCG/0.5 IM SUSP: 0.5000 mL | Freq: Once | INTRAMUSCULAR | 0 refills | Status: AC

## 2021-04-18 MED ORDER — ZOSTER VAC RECOMB ADJUVANTED 50 MCG/0.5ML IM SUSR: 0.5000 mL | Freq: Once | INTRAMUSCULAR | 0 refills | Status: AC

## 2021-04-18 NOTE — Progress Notes (Signed)
Office Note 04/18/2021  CC:  Chief Complaint  Patient presents with   Annual Exam    Pt is fasting    HPI:  Patient is a 75 y.o. male who is here for annual health maintenance exam and f/u mixed hyperlipidemia and hx of IDA.  At the time of last labs 07/2020 I recommended starting statin d/t elevated LDL.  He declined.  Matthew [name] is doing well.  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 He does take his iron tablet daily.  Past Medical History:  Diagnosis Date   Actinic keratosis    Allergic rhinitis    Basal cell carcinoma 12/08/2012   R temple - excision 01/18/13   BRBPR (bright red blood per rectum)    Int hemorrhoids   Family history of colon cancer in father    Hearing impairment    Noise exposure   History of diverticulitis of colon 03/2014   F/u CT showed resolution.   Hyperlipemia, mixed 03/2016   He declined statin 03/2016.  Mod high trigs 04/2020, recommended fenofibrate->he declined and these improved with TLC. Then I recommended statin again 07/2020.   Iron deficiency anemia 03/2016; 03/2017   Hemoccults x 3 neg 05/2016.  Pt never took iron.  03/2017 iron still very low, mild anemia--hemoccults x 3 pos and I recommended pt start ferrous sulfate and arrange f/u with his GI MD.  Noncompliant with iron--saw GI MD 04/2017 and EGD showed esophagitis (o/w bx neg) likely causing his IDA.  Needs PPI bid x 33mo, then qd lifetime, plus oral iron replacement. iron still low 04/2019 b/c noncomplianc   Prostatitis    Undescended left testicle    suspected to be atrophic    Past Surgical History:  Procedure Laterality Date   COLONOSCOPY  age 79, then again 09/2014   Recall 09/2017 per [facility] records   ESOPHAGOGASTRODUODENOSCOPY  04/2017   Dr. Kizzie Ide, likely responsible for his IDA. (otherwise, bx was NEG).   INGUINAL HERNIA REPAIR     1 as infant, one in 36s, 1 in [place]   ?2004   benign per pt report    Family History  Problem Relation Age of Onset   Cancer Father        colon and lung    Social History   Socioeconomic History   Marital status: Married    Spouse name: Not on file   Number of children: Not on file   Years of education: Not on file   Highest education level: Not on file  Occupational History   Not on file  Tobacco Use   Smoking status: Former   Smokeless tobacco: Former    Types: Chew  Substance and Sexual Activity   Alcohol use: Yes   Drug use: No   Sexual activity: Not on file  Other Topics Concern   Not on file  Social History Narrative   Married, 1 son.   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   No tobacco, rare alcohol, no drugs.   Social Determinants of Health   Financial Resource Strain: Not on file  Food Insecurity: Not on file  Transportation Needs: Not on file  Physical Activity: Not on file  Stress: Not on file  Social Connections: Not on file  Intimate Partner Violence: Not on file    Outpatient Medications Prior to Visit  Medication Sig Dispense Refill  Ferrous Sulfate Dried 200 (65 Fe) MG TABS Take by mouth daily.     fenofibrate 160 MG tablet Take 1 tablet (160 mg total) by mouth daily. (Patient not taking: Reported on 07/22/2020) 30 tablet 2   No facility-administered medications prior to visit.    Allergies  Allergen Reactions   Sulfa Antibiotics Other (See Comments)    "sick"    ROS Review of Systems  Constitutional:  Negative for appetite change, chills, fatigue and fever.  HENT:  Negative for congestion, dental problem, ear pain and sore throat.   Eyes:  Negative for discharge, redness and visual disturbance.  Respiratory:  Negative for cough, chest tightness, shortness of breath and wheezing.   Cardiovascular:  Negative for chest pain, palpitations and leg swelling.  Gastrointestinal:  Negative for abdominal pain, blood in stool, diarrhea, nausea and vomiting.   Genitourinary:  Negative for difficulty urinating, dysuria, flank pain, frequency, hematuria and urgency.  Musculoskeletal:  Negative for arthralgias, back pain, joint swelling, myalgias and neck stiffness.  Skin:  Negative for pallor and rash.  Neurological:  Negative for dizziness, speech difficulty, weakness and headaches.  Hematological:  Negative for adenopathy. Does not bruise/bleed easily.  Psychiatric/Behavioral:  Negative for confusion and sleep disturbance. The patient is not nervous/anxious.    PE; Vitals with BMI 04/18/2021 07/22/2020 04/17/2020  Height 5' 9.75" 5' 8.5" 5' 8.5"  Weight 205 lbs 6 oz 204 lbs 205 lbs 3 oz  BMI 29.67 42.59 56.38  Systolic 756 433 295  Diastolic 79 74 79  Pulse 51 52 75    Gen: Alert, well appearing.  Patient is oriented to person, place, time, and situation. AFFECT: pleasant, lucid thought and speech. ENT: Ears: EACs clear, normal epithelium.  TMs with good light reflex and landmarks bilaterally.  Eyes: no injection, icteris, swelling, or exudate.  EOMI, PERRLA. Nose: no drainage or turbinate edema/swelling.  No injection or focal lesion.  Mouth: lips without lesion/swelling.  Oral mucosa pink and moist.  Dentition intact and without obvious caries or gingival swelling.  Oropharynx without erythema, exudate, or swelling.  Neck: supple/nontender.  No LAD, mass, or TM.  Carotid pulses 2+ bilaterally, without bruits. CV: RRR, no m/r/g.   LUNGS: CTA bilat, nonlabored resps, good aeration in all lung fields. ABD: soft, NT, ND, BS normal.  No hepatospenomegaly or mass.  No bruits. EXT: no clubbing, cyanosis, or edema.  Musculoskeletal: no joint swelling, erythema, warmth, or tenderness.  ROM of all joints intact. Skin - no sores or suspicious lesions or rashes or color changes  Pertinent labs:  No results found for: TSH Lab Results  Component Value Date   WBC 7.6 04/17/2020   HGB 14.1 04/17/2020   HCT 41.5 04/17/2020   MCV 92.8 04/17/2020    PLT 218.0 04/17/2020   Lab Results  Component Value Date   CREATININE 1.14 04/17/2020   BUN 24 (H) 04/17/2020   NA 141 04/17/2020   K 3.8 04/17/2020   CL 106 04/17/2020   CO2 27 04/17/2020   Lab Results  Component Value Date   ALT 9 07/22/2020   AST 15 07/22/2020   ALKPHOS 50 07/22/2020   BILITOT 0.5 07/22/2020   Lab Results  Component Value Date   CHOL 209 (H) 07/22/2020   Lab Results  Component Value Date   HDL 47.30 07/22/2020   Lab Results  Component Value Date   LDLCALC 126 (H) 07/22/2020   Lab Results  Component Value Date   TRIG 175.0 (H) 07/22/2020  Lab Results  Component Value Date   CHOLHDL 4 07/22/2020   Lab Results  Component Value Date   PSA 1.65 04/17/2020   PSA 1.51 03/31/2018   PSA 1.66 03/30/2017   Lab Results  Component Value Date   IRON 133 04/17/2020   TIBC 419 04/17/2020   FERRITIN 34 04/17/2020   Lab Results  Component Value Date   VITAMINB12 396 04/17/2020   ASSESSMENT AND PLAN:   1) Mixed HLD: He has declined a trial of fenofibrate in the past as well as a trial of a statin in the past. FLP and hepatic panel today.  2) Hx of IDA: Taking iron daily.  No sign of GI bleeding. CBC and iron panel today.  3) Health maintenance exam: Reviewed age and gender appropriate health maintenance issues (prudent diet, regular exercise, health risks of tobacco and excessive alcohol, use of seatbelts, fire alarms in home, use of sunscreen).  Also reviewed age and gender appropriate health screening as well as vaccine recommendations. Vaccines: Flu->declined.  Tdap and shingrix rx's sent to pharmacy. Labs: cbc,cmet, lipid, iron, psa. Prostate ca screening: benefits vs risks of ongoing PSA testing at this point were reviewed---> patient did elect to get this test today. Colon ca screening:  Has hx of adenomatous polyps, [facility] approx 5-6 yrs ago->gave pt their contact info at his cpe last year so he could contact them to arrange rpt-->  patient has not done this yet and says he will try to make time to do this.  An After Visit Summary was printed and given to the patient.  FOLLOW UP:  Return in about 1 year (around 04/18/2022) for annual CPE (fasting).  Signed:  Crissie Sickles, MD           04/18/2021

## 2021-04-18 NOTE — Patient Instructions (Signed)

## 2021-04-19 LAB — IRON,TIBC AND FERRITIN PANEL
%SAT: 25 % (calc) (ref 20–48)
Ferritin: 27 ng/mL (ref 24–380)
Iron: 95 ug/dL (ref 50–180)
TIBC: 379 mcg/dL (calc) (ref 250–425)

## 2021-08-04 ENCOUNTER — Ambulatory Visit: Payer: Medicare Other | Admitting: Family Medicine

## 2021-08-06 ENCOUNTER — Other Ambulatory Visit: Payer: Self-pay

## 2021-08-07 ENCOUNTER — Encounter: Payer: Self-pay | Admitting: Family Medicine

## 2021-08-07 ENCOUNTER — Ambulatory Visit (INDEPENDENT_AMBULATORY_CARE_PROVIDER_SITE_OTHER): Payer: Medicare Other | Admitting: Family Medicine

## 2021-08-07 ENCOUNTER — Telehealth: Payer: Self-pay

## 2021-08-07 VITALS — BP 127/81 | HR 64 | Temp 98.3°F | Ht 69.75 in | Wt 193.2 lb

## 2021-08-07 DIAGNOSIS — J069 Acute upper respiratory infection, unspecified: Secondary | ICD-10-CM

## 2021-08-07 DIAGNOSIS — J209 Acute bronchitis, unspecified: Secondary | ICD-10-CM | POA: Diagnosis not present

## 2021-08-07 MED ORDER — PREDNISONE 20 MG PO TABS: ORAL_TABLET | ORAL | 0 refills | Status: DC

## 2021-08-07 NOTE — Telephone Encounter (Signed)
Pt contacted to schedule AWV. Pt agreed and scheduled for Sat 3/11 ?

## 2021-08-07 NOTE — Patient Instructions (Signed)
Buy over the counter cough medication called mucinex DM and take as directed on the packaging. ? ?I expect this to gradually dissipate over the next 7-10 days, hopefully sooner with use of the prednisone I prescribed today. ?

## 2021-08-07 NOTE — Progress Notes (Signed)
OFFICE VISIT ? ?08/07/2021 ? ?CC:  ?Chief Complaint  ?Patient presents with  ? chest congestion  ?  Has not taken any otc meds but is coughing up light green phlegm.   ? ? ?Patient is a 76 y.o. male who presents for cough. ?Onset of nasal congestion, sore throat, and cough about 2 weeks ago. ?The nasal congestion and sore throat have essentially resolved and he still has a significant cough that is productive of some thin greenish mucus.  No shortness of breath or wheezing but he has some fatigue.  No fever, no chest pain.  He is eating and drinking well. ?He does state he has gradually been improving lately. ? ?ROS as above, plus--> nno cough, no dizziness, no HAs, no rashes, no melena/hematochezia.  No polyuria or polydipsia.  No myalgias or arthralgias.  No focal weakness, paresthesias, or tremors.  No acute vision or hearing abnormalities.  No dysuria or unusual/new urinary urgency or frequency.  No recent changes in lower legs. ?No n/v/d or abd pain.  No palpitations.   ? ? ?Past Medical History:  ?Diagnosis Date  ? Actinic keratosis   ? Allergic rhinitis   ? Basal cell carcinoma 12/08/2012  ? R temple - excision 01/18/13  ? BRBPR (bright red blood per rectum)   ? Int hemorrhoids  ? Family history of colon cancer in father   ? Hearing impairment   ? Noise exposure  ? History of diverticulitis of colon 03/2014  ? F/u CT showed resolution.  ? Hyperlipemia, mixed 03/2016  ? He declined statin 03/2016.  Mod high trigs 04/2020, recommended fenofibrate->he declined and these improved with TLC. Then I recommended statin again 07/2020.  ? Iron deficiency anemia 03/2016; 03/2017  ? Hemoccults x 3 neg 05/2016.  Pt never took iron.  03/2017 iron still very low, mild anemia--hemoccults x 3 pos and I recommended pt start ferrous sulfate and arrange f/u with his GI MD.  Noncompliant with iron--saw GI MD 04/2017 and EGD showed esophagitis (o/w bx neg) likely causing his IDA.  Needs PPI bid x 60mo then qd lifetime, plus oral iron  replacement. iron still low 04/2019 b/c noncomplianc  ? Prostatitis   ? Undescended left testicle   ? suspected to be atrophic  ? ? ?Past Surgical History:  ?Procedure Laterality Date  ? COLONOSCOPY  age 76 then again 09/2014  ? [facility]  ? ESOPHAGOGASTRODUODENOSCOPY  04/2017  ? Dr. GKizzie Ide likely responsible for his IDA. (otherwise, bx was NEG).  ? INGUINAL HERNIA REPAIR    ? 1 as infant, one in 15s 1 in 150s ? PROSTATE BIOPSY  ?2004  ? benign per pt report  ? ? ?Outpatient Medications Prior to Visit  ?Medication Sig Dispense Refill  ? Ferrous Sulfate Dried 200 (65 Fe) MG TABS Take by mouth daily.    ? Multiple Vitamins-Minerals (ZINC PO) Take by mouth daily.    ? fenofibrate 160 MG tablet Take 1 tablet (160 mg total) by mouth daily. (Patient not taking: Reported on 07/22/2020) 30 tablet 2  ? ?No facility-administered medications prior to visit.  ? ? ?Allergies  ?Allergen Reactions  ? Sulfa Antibiotics Other (See Comments)  ?  "sick"  ? ? ?ROS ?As per HPI ? ?PE: ?Vitals with BMI 08/07/2021 04/18/2021 07/22/2020  ?Height 5' 9.75" 5' 9.75" 5' 8.5"  ?Weight 193 lbs 3 oz 205 lbs 6 oz 204 lbs  ?BMI 27.91 29.67 30.56  ?Systolic [id number] ?Diastolic 81  79 74  ?Pulse 64 51 52  ? ?02 sat 97% on RA ? ?Physical Exam ? ?VS: noted--normal. ?Gen: alert, NAD, NONTOXIC APPEARING. ?HEENT: eyes without injection, drainage, or swelling.  Ears: EACs clear, TMs with normal light reflex and landmarks.  Nose: Clear, no injection, no purulent d/c.  No paranasal sinus TTP.  No facial swelling.  Throat and mouth without focal lesion.  No pharyngial swelling, erythema, or exudate.   ?Neck: supple, no LAD.   ?LUNGS: CTA bilat, nonlabored resps.  He does cough some after making a few deep exhalations.  Expiratory phase is not prolonged. ?CV: RRR, no m/r/g.   ?EXT: no c/c/e ?SKIN: no rash ? ? ?LABS:  ?Last CBC ?Lab Results  ?Component Value Date  ? WBC 5.0 04/18/2021  ? HGB 14.3 04/18/2021  ? HCT 42.4  04/18/2021  ? MCV 92.4 04/18/2021  ? RDW 13.8 04/18/2021  ? PLT 214.0 04/18/2021  ? ?Last metabolic panel ?Lab Results  ?Component Value Date  ? GLUCOSE 92 04/18/2021  ? NA 140 04/18/2021  ? K 4.4 04/18/2021  ? CL 104 04/18/2021  ? CO2 28 04/18/2021  ? BUN 22 04/18/2021  ? CREATININE 0.99 04/18/2021  ? CALCIUM 9.5 04/18/2021  ? PROT 6.3 04/18/2021  ? ALBUMIN 4.4 04/18/2021  ? BILITOT 0.5 04/18/2021  ? ALKPHOS 49 04/18/2021  ? AST 15 04/18/2021  ? ALT 10 04/18/2021  ? ?IMPRESSION AND PLAN: ? ?Viral URI with acute bronchitis. ?Low suspicion of lobar pneumonia. ?Prednisone 40 mg a day x5 days, then 20 mg a day x5 days. ?Recommended Mucinex DM over-the-counter as directed on the packaging. ?Signs/symptoms to call or return for were reviewed and pt expressed understanding. ? ?An After Visit Summary was printed and given to the patient. ? ?FOLLOW UP: No follow-ups on file. ? ?Signed:  Crissie Sickles, MD           08/07/2021 ? ?

## 2021-08-09 ENCOUNTER — Ambulatory Visit (INDEPENDENT_AMBULATORY_CARE_PROVIDER_SITE_OTHER): Payer: Medicare Other

## 2021-08-09 DIAGNOSIS — Z0000 Encounter for general adult medical examination without abnormal findings: Secondary | ICD-10-CM

## 2021-08-09 MED ORDER — SHINGRIX 50 MCG/0.5ML IM SUSR: 0.5000 mL | Freq: Once | INTRAMUSCULAR | 1 refills | Status: AC

## 2021-08-09 MED ORDER — TETANUS-DIPHTH-ACELL PERTUSSIS 5-2.5-18.5 LF-MCG/0.5 IM SUSP: 0.5000 mL | Freq: Once | INTRAMUSCULAR | 0 refills | Status: AC

## 2021-08-09 NOTE — Progress Notes (Signed)
Subjective:   Matthew [name] is a 76 y.o. male who presents for an Initial Medicare Annual Wellness Visit. I connected with  Matthew [name] on 08/09/21 by a audio enabled telemedicine application and verified that I am speaking with the correct person using two identifiers.  Patient Location: Home  Provider Location: Home Office  I discussed the limitations of evaluation and management by telemedicine. The patient expressed understanding and agreed to proceed.   Review of Systems    Defer to PCP Cardiac Risk Factors include: advanced age (>55mn, >>52women)     Objective:    There were no vitals filed for this visit. There is no height or weight on file to calculate BMI.  Advanced Directives 08/09/2021  Does Patient Have a Medical Advance Directive? No;Yes  Type of Advance Directive Living will    Current Medications (verified) Outpatient Encounter Medications as of 08/09/2021  Medication Sig   fenofibrate 160 MG tablet Take 1 tablet (160 mg total) by mouth daily. (Patient not taking: Reported on 07/22/2020)   Ferrous Sulfate Dried 200 (65 Fe) MG TABS Take by mouth daily.   Multiple Vitamins-Minerals (ZINC PO) Take by mouth daily.   predniSONE (DELTASONE) 20 MG tablet 2 tabs po qd x 5d, then 1 tab po qd x 5d   No facility-administered encounter medications on file as of 08/09/2021.    Allergies (verified) Sulfa antibiotics   History: Past Medical History:  Diagnosis Date   Actinic keratosis    Allergic rhinitis    Basal cell carcinoma 12/08/2012   R temple - excision 01/18/13   BRBPR (bright red blood per rectum)    Int hemorrhoids   Family history of colon cancer in father    Hearing impairment    Noise exposure   History of diverticulitis of colon 03/2014   F/u CT showed resolution.   Hyperlipemia, mixed 03/2016   He declined statin 03/2016.  Mod high trigs 04/2020, recommended fenofibrate->he declined and these improved with TLC. Then I recommended statin again  07/2020.   Iron deficiency anemia 03/2016; 03/2017   Hemoccults x 3 neg 05/2016.  Pt never took iron.  03/2017 iron still very low, mild anemia--hemoccults x 3 pos and I recommended pt start ferrous sulfate and arrange f/u with his GI MD.  Noncompliant with iron--saw GI MD 04/2017 and EGD showed esophagitis (o/w bx neg) likely causing his IDA.  Needs PPI bid x 154mothen qd lifetime, plus oral iron replacement. iron still low 04/2019 b/c noncomplianc   Prostatitis    Undescended left testicle    suspected to be atrophic   Past Surgical History:  Procedure Laterality Date   COLONOSCOPY  age [age]then again 09/2014   Recall 09/2017 per [facility]   ESOPHAGOGASTRODUODENOSCOPY  04/2017   Dr. GrKizzie Idelikely responsible for his IDA. (otherwise, bx was NEG).   INGUINAL HERNIA REPAIR     1 as infant, one in 1947s1 in [place]?2004   benign per pt report   Family History  Problem Relation Age of Onset   Cancer Father        colon and lung   Social History   Socioeconomic History   Marital status: Married    Spouse name: Not on file   Number of children: Not on file   Years of education: Not on file   Highest education level: Not on file  Occupational History   Not on file  Tobacco  Use   Smoking status: Former   Smokeless tobacco: Former    Types: Chew  Substance and Sexual Activity   Alcohol use: Yes   Drug use: No   Sexual activity: Not on file  Other Topics Concern   Not on file  Social History Narrative   Married, 1 son.   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   No tobacco, rare alcohol, no drugs.   Social Determinants of Health   Financial Resource Strain: Low Risk    Difficulty of Paying Living Expenses: Not hard at all  Food Insecurity: No Food Insecurity   Worried About Charity fundraiser in the Last Year: Never true   [place] in the Last Year: Never true  Transportation Needs: No Transportation Needs    Lack of Transportation (Medical): No   Lack of Transportation (Non-Medical): No  Physical Activity: Sufficiently Active   Days of Exercise per Week: 6 days   Minutes of Exercise per Session: 30 min  Stress: No Stress Concern Present   Feeling of Stress : Not at all  Social Connections: Socially Integrated   Frequency of Communication with Friends and Family: More than three times a week   Frequency of Social Gatherings with Friends and Family: More than three times a week   Attends Religious Services: More than 4 times per year   Active Member of [employer] or Organizations: Yes   Attends Music therapist: More than 4 times per year   Marital Status: Married    Tobacco Counseling Counseling given: Not Answered   Clinical Intake:  Pre-visit preparation completed: Yes  Pain : No/denies pain     Nutritional Risks: Unintentional weight loss Diabetes: No  How often do you need to have someone help you when you read instructions, pamphlets, or other written materials from your doctor or pharmacy?: 1 - Never What is the last grade level you completed in school?: college  Diabetic?No  Interpreter Needed?: No      Activities of Daily Living In your present state of health, do you have any difficulty performing the following activities: 08/09/2021  Hearing? Y  Vision? Y  Difficulty concentrating or making decisions? N  Walking or climbing stairs? N  Dressing or bathing? N  Doing errands, shopping? N  Preparing Food and eating ? N  Using the Toilet? N  In the past six months, have you accidently leaked urine? N  Do you have problems with loss of bowel control? N  Managing your Medications? N  Managing your Finances? N  Housekeeping or managing your Housekeeping? N  Some recent data might be hidden    Patient Care Team: Tammi Sou, MD as PCP - General (Family Medicine) Thelma Comp, MD as Consulting Physician (Gastroenterology) Ralene Bathe, MD as  Consulting Physician (Dermatology) Coral Spikes, MD as Consulting Physician (Gastroenterology)  Indicate any recent Medical Services you may have received from other than [facility] providers in the past year (date may be approximate).     Assessment:   This is a routine wellness examination for Matthew [name].  Hearing/Vision screen No results found.  Dietary issues and exercise activities discussed:     Goals Addressed   None   Depression Screen PHQ 2/9 Scores 08/09/2021 04/18/2021 04/17/2020 04/05/2019 03/31/2018 03/30/2017 03/18/2016  PHQ - 2 Score 0 0 0 0 0 0 0    Fall Risk Fall Risk  08/09/2021 04/18/2021 04/17/2020 03/31/2018 03/30/2017  Falls in  the past year? 0 0 0 No No  Number falls in past yr: 0 0 0 - -  Injury with Fall? 0 0 0 - -  Risk for fall due to : History of fall(s) - - - -  Follow up Falls evaluation completed Falls evaluation completed Falls evaluation completed - -    FALL RISK PREVENTION PERTAINING TO THE HOME:  Any stairs in or around the home? Yes  If so, are there any without handrails? Yes  Home free of loose throw rugs in walkways, pet beds, electrical cords, etc? Yes  Adequate lighting in your home to reduce risk of falls? Yes   ASSISTIVE DEVICES UTILIZED TO PREVENT FALLS:  Life alert? No  Use of a cane, walker or w/c? No  Grab bars in the bathroom? No  Shower chair or bench in shower? Yes  Elevated toilet seat or a handicapped toilet? Yes   TIMED UP AND GO:  Was the test performed? No .  Length of time to ambulate 10 feet: n/a sec.    Cognitive Function:     6CIT Screen 08/09/2021  What Year? 0 points  What month? 0 points  What time? 0 points  Count back from 20 0 points  Months in reverse 0 points  Repeat phrase 0 points  Total Score 0    Immunizations Immunization History  Administered Date(s) Administered   Fluad Quad(high Dose 65+) 04/05/2019   Influenza, High Dose Seasonal PF 03/26/2015, 03/18/2016, 03/30/2017, 03/31/2018    Influenza,inj,Quad PF,6+ Mos 03/29/2014   Moderna Sars-Covid-2 Vaccination 10/26/2019, 11/23/2019   Pneumococcal Conjugate-13 03/18/2016   Pneumococcal Polysaccharide-23 03/30/2017    TDAP status: Due, Education has been provided regarding the importance of this vaccine. Advised may receive this vaccine at local pharmacy or Health Dept. Aware to provide a copy of the vaccination record if obtained from local pharmacy or Health Dept. Verbalized acceptance and understanding.  Flu Vaccine status: Declined, Education has been provided regarding the importance of this vaccine but patient still declined. Advised may receive this vaccine at local pharmacy or Health Dept. Aware to provide a copy of the vaccination record if obtained from local pharmacy or Health Dept. Verbalized acceptance and understanding.  Pneumococcal vaccine status: Up to date  Covid-19 vaccine status: Information provided on how to obtain vaccines.   Qualifies for Shingles Vaccine? Yes   Zostavax completed No   Shingrix Completed?: No.    Education has been provided regarding the importance of this vaccine. Patient has been advised to call insurance company to determine out of pocket expense if they have not yet received this vaccine. Advised may also receive vaccine at local pharmacy or Health Dept. Verbalized acceptance and understanding.  Screening Tests Health Maintenance  Topic Date Due   TETANUS/TDAP  Never done   COVID-19 Vaccine (3 - Moderna risk series) 08/23/2021 (Originally 12/21/2019)   INFLUENZA VACCINE  08/29/2021 (Originally 12/30/2020)   Zoster Vaccines- Shingrix (1 of 2) 11/07/2021 (Originally 10/13/1964)   Pneumonia Vaccine 20+ Years old  Completed   Hepatitis C Screening  Completed   HPV VACCINES  Aged Out    Health Maintenance  Health Maintenance Due  Topic Date Due   TETANUS/TDAP  Never done    Colorectal cancer screening: No longer required.   Lung Cancer Screening: (Low Dose CT Chest  recommended if Age 6-80 years, 30 pack-year currently smoking OR have quit w/in 15years.) does not qualify.   Lung Cancer Screening Referral: n/a  Additional Screening:  Hepatitis  C Screening: does qualify; Completed 04/05/19  Vision Screening: Recommended annual ophthalmology exams for early detection of glaucoma and other disorders of the eye. Is the patient up to date with their annual eye exam?  No  Who is the provider or what is the name of the office in which the patient attends annual eye exams? My eye doctor If pt is not established with a provider, would they like to be referred to a provider to establish care? No .   Dental Screening: Recommended annual dental exams for proper oral hygiene  Community Resource Referral / Chronic Care Management: CRR required this visit?  No   CCM required this visit?  No      Plan:     I have personally reviewed and noted the following in the patients chart:   Medical and social history Use of alcohol, tobacco or illicit drugs  Current medications and supplements including opioid prescriptions. Patient is not currently taking opioid prescriptions. Functional ability and status Nutritional status Physical activity Advanced directives List of other physicians Hospitalizations, surgeries, and ER visits in previous 12 months Vitals Screenings to include cognitive, depression, and falls Referrals and appointments  In addition, I have reviewed and discussed with patient certain preventive protocols, quality metrics, and best practice recommendations. A written personalized care plan for preventive services as well as general preventive health recommendations were provided to patient.     Kavin Leech, [facility]   08/09/2021   Nurse Notes: Non face to face 14 minutes.  Matthew [name] ,  Matthew [name] , Thank you for taking time to come for your Medicare Wellness Visit. I appreciate your ongoing commitment to your health goals. Please review the  following plan we discussed and let me know if I can assist you in the future.   These are the goals we discussed:  Goals   None     This is a list of the screening recommended for you and due dates:  Health Maintenance  Topic Date Due   Tetanus Vaccine  Never done   COVID-19 Vaccine (3 - Moderna risk series) 08/23/2021*   Flu Shot  08/29/2021*   Zoster (Shingles) Vaccine (1 of 2) 11/07/2021*   Pneumonia Vaccine  Completed   Hepatitis C Screening: USPSTF Recommendation to screen - Ages 34-79 yo.  Completed   HPV Vaccine  Aged Out  *Topic was postponed. The date shown is not the original due date.

## 2021-09-10 ENCOUNTER — Ambulatory Visit (INDEPENDENT_AMBULATORY_CARE_PROVIDER_SITE_OTHER): Payer: Medicare Other | Admitting: Family Medicine

## 2021-09-10 ENCOUNTER — Encounter: Payer: Self-pay | Admitting: Family Medicine

## 2021-09-10 VITALS — BP 138/92 | HR 98 | Temp 99.8°F | Ht 69.75 in | Wt 194.6 lb

## 2021-09-10 DIAGNOSIS — R509 Fever, unspecified: Secondary | ICD-10-CM

## 2021-09-10 DIAGNOSIS — L089 Local infection of the skin and subcutaneous tissue, unspecified: Secondary | ICD-10-CM

## 2021-09-10 DIAGNOSIS — Z23 Encounter for immunization: Secondary | ICD-10-CM | POA: Diagnosis not present

## 2021-09-10 DIAGNOSIS — W540XXA Bitten by dog, initial encounter: Secondary | ICD-10-CM | POA: Diagnosis not present

## 2021-09-10 DIAGNOSIS — R5381 Other malaise: Secondary | ICD-10-CM | POA: Diagnosis not present

## 2021-09-10 MED ORDER — CEFTRIAXONE SODIUM 500 MG IJ SOLR
500.0000 mg | Freq: Once | INTRAMUSCULAR | Status: AC
  Administered 2021-09-10: 500 mg via INTRAMUSCULAR

## 2021-09-10 MED ORDER — AMOXICILLIN-POT CLAVULANATE 875-125 MG PO TABS: 1.0000 | ORAL_TABLET | Freq: Two times a day (BID) | ORAL | 0 refills | Status: AC

## 2021-09-10 NOTE — Progress Notes (Signed)
OFFICE VISIT ? ?09/10/2021 ? ?CC:  ?Chief Complaint  ?Patient presents with  ? hand swelling  ?  Left; his dog bit him 3 days ago and swelling developed 24hr after bite. Applied ice and soaked in epsom salt, sprayed with alcohol and washed with soap and water.   ? ?Patient is a 76 y.o. male who presents for left hand swelling. ? ?HPI: ?His dog bit him 4 days ago. ?His dog has been healthy and has had all appropriate vaccines. ?He has a toy poodle and it is temperamental when lying in his bed with a chew toy. ?Right knee reached into pick it up and it bit him on the left hand.  Just a small amount of skin penetrance and bleeding.  About 12-15 hours later he noticed it started to swell quite a bit and hurt and turn red, has gradually worsened since then.  He feels fatigued/rundown since it happened. ?No known fever. ? ?ROS as above, plus-->  no CP, no SOB, no wheezing, no cough, no dizziness, no HAs, no rashes, no melena/hematochezia.  No polyuria or polydipsia.  No myalgias or arthralgias.  No focal weakness, paresthesias, or tremors.  No acute vision or hearing abnormalities.  No dysuria or unusual/new urinary urgency or frequency.  No recent changes in lower legs. ?No n/v/d or abd pain.  No palpitations.   ? ? ?Past Medical History:  ?Diagnosis Date  ? Actinic keratosis   ? Allergic rhinitis   ? Basal cell carcinoma 12/08/2012  ? R temple - excision 01/18/13  ? BRBPR (bright red blood per rectum)   ? Int hemorrhoids  ? Family history of colon cancer in father   ? Hearing impairment   ? Noise exposure  ? History of diverticulitis of colon 03/2014  ? F/u CT showed resolution.  ? Hyperlipemia, mixed 03/2016  ? He declined statin 03/2016.  Mod high trigs 04/2020, recommended fenofibrate->he declined and these improved with TLC. Then I recommended statin again 07/2020.  ? Iron deficiency anemia 03/2016; 03/2017  ? Hemoccults x 3 neg 05/2016.  Pt never took iron.  03/2017 iron still very low, mild anemia--hemoccults x 3  pos and I recommended pt start ferrous sulfate and arrange f/u with his GI MD.  Noncompliant with iron--saw GI MD 04/2017 and EGD showed esophagitis (o/w bx neg) likely causing his IDA.  Needs PPI bid x 28mo then qd lifetime, plus oral iron replacement. iron still low 04/2019 b/c noncomplianc  ? Prostatitis   ? Undescended left testicle   ? suspected to be atrophic  ? ? ?Past Surgical History:  ?Procedure Laterality Date  ? COLONOSCOPY  age 89 then again 09/2014  ? [facility]  ? ESOPHAGOGASTRODUODENOSCOPY  04/2017  ? Dr. GKizzie Ide likely responsible for his IDA. (otherwise, bx was NEG).  ? INGUINAL HERNIA REPAIR    ? 1 as infant, one in 143s 1 in 12s ? PROSTATE BIOPSY  ?2004  ? benign per pt report  ? ? ?Outpatient Medications Prior to Visit  ?Medication Sig Dispense Refill  ? Ferrous Sulfate Dried 200 (65 Fe) MG TABS Take by mouth daily.    ? Multiple Vitamins-Minerals (ZINC PO) Take by mouth daily.    ? fenofibrate 160 MG tablet Take 1 tablet (160 mg total) by mouth daily. (Patient not taking: Reported on 07/22/2020) 30 tablet 2  ? predniSONE (DELTASONE) 20 MG tablet 2 tabs po qd x 5d, then 1 tab po qd x 5d (Patient not  taking: Reported on 09/10/2021) 15 tablet 0  ? ?No facility-administered medications prior to visit.  ? ? ?Allergies  ?Allergen Reactions  ? Sulfa Antibiotics Other (See Comments)  ?  "sick"  ? ? ?ROS ?As per HPI ? ?PE: ? ?  09/10/2021  ?  3:04 PM 08/07/2021  ?  8:05 AM 04/18/2021  ?  8:01 AM  ?Vitals with BMI  ?Height 5' 9.75" 5' 9.75" 5' 9.75"  ?Weight 194 lbs 10 oz 193 lbs 3 oz 205 lbs 6 oz  ?BMI 28.11 27.91 29.67  ?Systolic 604 540 981  ?Diastolic 92 81 79  ?Pulse 98 64 51  ?T 99.8 presently ? ? ?Physical Exam ? ?General: Alert and well-appearing. ?Small scabbed-over puncture/abrasion wound noted on dorsal aspect of hand medially (left hand).  Left wrist and hand with moderate amount of swelling and erythema diffusely.  Warmth to this area noted.  No focal  fluctuance to suggest abscess.  No streaking. ?Radial and ulnar pulses 2+. ?Full range of motion of the wrist with minimal discomfort. ?Extension of fingers is fully intact but unable to fully flex fingers due to swelling. ? ?LABS:  ?Last CBC ?Lab Results  ?Component Value Date  ? WBC 5.0 04/18/2021  ? HGB 14.3 04/18/2021  ? HCT 42.4 04/18/2021  ? MCV 92.4 04/18/2021  ? RDW 13.8 04/18/2021  ? PLT 214.0 04/18/2021  ? ?Last metabolic panel ?Lab Results  ?Component Value Date  ? GLUCOSE 92 04/18/2021  ? NA 140 04/18/2021  ? K 4.4 04/18/2021  ? CL 104 04/18/2021  ? CO2 28 04/18/2021  ? BUN 22 04/18/2021  ? CREATININE 0.99 04/18/2021  ? CALCIUM 9.5 04/18/2021  ? PROT 6.3 04/18/2021  ? ALBUMIN 4.4 04/18/2021  ? BILITOT 0.5 04/18/2021  ? ALKPHOS 49 04/18/2021  ? AST 15 04/18/2021  ? ALT 10 04/18/2021  ? ?IMPRESSION AND PLAN: ? ?Infected dog bite, left hand. ?Mild systemic symptoms--> malaise and low-grade fever. ?Rocephin 1 g IM today in office. ?Start Augmentin 875 twice daily x9 days--begin this tomorrow. ?Recommended ibuprofen 600 mg 3 times daily with food for 3 days. ?Tdap given today. ? ?(Bedside ultrasound today of left hand and wrist showed no focal anechoic or hypoechoic changes to suggest abscess.  Wrist, thumb, and fingers extensor tendon sheaths show no surrounding hypoechoic changes, no hyperemia. ?No wrist effusion). ? ?An After Visit Summary was printed and given to the patient. ? ?FOLLOW UP: Return in about 2 days (around 09/12/2021) for f/u hand infection/bite. ? ?Signed:  Crissie Sickles, MD           09/10/2021 ? ?

## 2021-09-10 NOTE — Patient Instructions (Signed)
Take 3 over the counter ibuprofen three times a day with food for the next 3 days. ? ?

## 2021-09-12 ENCOUNTER — Encounter: Payer: Self-pay | Admitting: Family Medicine

## 2021-09-12 ENCOUNTER — Ambulatory Visit: Payer: Medicare Other | Admitting: Family Medicine

## 2021-09-12 VITALS — BP 147/95 | HR 62 | Temp 97.9°F | Ht 69.75 in | Wt 195.8 lb

## 2021-09-12 DIAGNOSIS — W540XXA Bitten by dog, initial encounter: Secondary | ICD-10-CM | POA: Diagnosis not present

## 2021-09-12 DIAGNOSIS — L089 Local infection of the skin and subcutaneous tissue, unspecified: Secondary | ICD-10-CM

## 2021-09-12 DIAGNOSIS — R059 Cough, unspecified: Secondary | ICD-10-CM | POA: Diagnosis not present

## 2021-09-12 NOTE — Patient Instructions (Signed)
Buy over the counter generic zyrtec '10mg'$  and take one every evening for a week to see if it helps for your coughing ?

## 2021-09-12 NOTE — Progress Notes (Signed)
OFFICE VISIT ? ?09/12/2021 ? ?CC: f/u hand infxn ? ?Patient is a 76 y.o. male who presents for 2-day follow-up left hand infected dog bite. ?A/P as of last visit: ?"Infected dog bite, left hand. ?Mild systemic symptoms--> malaise and low-grade fever. ?Rocephin 1 g IM today in office. ?Start Augmentin 875 twice daily x9 days--begin this tomorrow. ?Recommended ibuprofen 600 mg 3 times daily with food for 3 days. ?Tdap given today." ? ?INTERIM HX: ?Hand swelling is quite a bit down.  No pain.  No fever or malaise.  No problems taking Augmentin. ? ?Ebbie feels well other than some coughing lately.  He had a significant URI about 6 to 7 weeks ago.  I saw him and treated him with prednisone and he says this cleared up almost completely--just this lingering cough ; when laying down at night it is worse.  Little tickle in the throat at times.  No wheezing or shortness of breath or chest pain.   ?Not much sneezing or nasal congestion/runny nose.  No significant eyes watering or itching ? ?Past Medical History:  ?Diagnosis Date  ? Actinic keratosis   ? Allergic rhinitis   ? Basal cell carcinoma 12/08/2012  ? R temple - excision 01/18/13  ? BRBPR (bright red blood per rectum)   ? Int hemorrhoids  ? Family history of colon cancer in father   ? Hearing impairment   ? Noise exposure  ? History of diverticulitis of colon 03/2014  ? F/u CT showed resolution.  ? Hyperlipemia, mixed 03/2016  ? He declined statin 03/2016.  Mod high trigs 04/2020, recommended fenofibrate->he declined and these improved with TLC. Then I recommended statin again 07/2020.  ? Iron deficiency anemia 03/2016; 03/2017  ? Hemoccults x 3 neg 05/2016.  Pt never took iron.  03/2017 iron still very low, mild anemia--hemoccults x 3 pos and I recommended pt start ferrous sulfate and arrange f/u with his GI MD.  Noncompliant with iron--saw GI MD 04/2017 and EGD showed esophagitis (o/w bx neg) likely causing his IDA.  Needs PPI bid x 94mo then qd lifetime, plus oral  iron replacement. iron still low 04/2019 b/c noncomplianc  ? Prostatitis   ? Undescended left testicle   ? suspected to be atrophic  ? ? ?Past Surgical History:  ?Procedure Laterality Date  ? COLONOSCOPY  age 76 then again 09/2014  ? [facility]  ? ESOPHAGOGASTRODUODENOSCOPY  04/2017  ? Dr. GKizzie Ide likely responsible for his IDA. (otherwise, bx was NEG).  ? INGUINAL HERNIA REPAIR    ? 1 as infant, one in 188s 1 in 142s ? PROSTATE BIOPSY  ?2004  ? benign per pt report  ? ? ?Outpatient Medications Prior to Visit  ?Medication Sig Dispense Refill  ? amoxicillin-clavulanate (AUGMENTIN) 875-125 MG tablet Take 1 tablet by mouth 2 (two) times daily for 9 days. 18 tablet 0  ? Ferrous Sulfate Dried 200 (65 Fe) MG TABS Take by mouth daily.    ? Multiple Vitamins-Minerals (ZINC PO) Take by mouth daily.    ? fenofibrate 160 MG tablet Take 1 tablet (160 mg total) by mouth daily. (Patient not taking: Reported on 07/22/2020) 30 tablet 2  ? ?No facility-administered medications prior to visit.  ? ? ?Allergies  ?Allergen Reactions  ? Sulfa Antibiotics Other (See Comments)  ?  "sick"  ? ? ?ROS ?As per HPI ? ?PE: ? ?  09/12/2021  ?  1:11 PM 09/10/2021  ?  3:04 PM 08/07/2021  ?  8:05 AM  ?Vitals with BMI  ?Height 5' 9.75" 5' 9.75" 5' 9.75"  ?Weight 195 lbs 13 oz 194 lbs 10 oz 193 lbs 3 oz  ?BMI 28.28 28.11 27.91  ?Systolic 161 096 045  ?Diastolic 95 92 81  ?Pulse 62 98 64  ? ? ? ?Physical Exam ? ?Gen: Alert, well appearing.  Patient is oriented to person, place, time, and situation. ?AFFECT: pleasant, lucid thought and speech. ?WUJ:WJXB: no injection, icteris, swelling, or exudate.  EOMI, PERRLA. ?Mouth: lips without lesion/swelling.  Oral mucosa pink and moist. Oropharynx without erythema, exudate, or swelling.  ?CV: RRR, no m/r/g.   ?LUNGS: CTA bilat, nonlabored resps, good aeration in all lung fields. ?Left hand: No erythema or warmth.  He has mild swelling over the dorsal aspect of the metacarpals.   Full range of motion of wrist and fingers. ? ?LABS:  ?Last CBC ?Lab Results  ?Component Value Date  ? WBC 5.0 04/18/2021  ? HGB 14.3 04/18/2021  ? HCT 42.4 04/18/2021  ? MCV 92.4 04/18/2021  ? RDW 13.8 04/18/2021  ? PLT 214.0 04/18/2021  ? ?Last metabolic panel ?Lab Results  ?Component Value Date  ? GLUCOSE 92 04/18/2021  ? NA 140 04/18/2021  ? K 4.4 04/18/2021  ? CL 104 04/18/2021  ? CO2 28 04/18/2021  ? BUN 22 04/18/2021  ? CREATININE 0.99 04/18/2021  ? CALCIUM 9.5 04/18/2021  ? PROT 6.3 04/18/2021  ? ALBUMIN 4.4 04/18/2021  ? BILITOT 0.5 04/18/2021  ? ALKPHOS 49 04/18/2021  ? AST 15 04/18/2021  ? ALT 10 04/18/2021  ? ?IMPRESSION AND PLAN: ? ?#1 left hand dog bite infection. ?Improving appropriately.  Finish Augmentin. ? ?#2 cough.  Just some residual status post acute bronchitis 6 weeks ago.   ?Exam normal. ?Reassured. ?Recommended trial of Zyrtec 10 mg over-the-counter. ?Signs/symptoms to call or return for were reviewed and pt expressed understanding. ? ?An After Visit Summary was printed and given to the patient. ? ?FOLLOW UP: No follow-ups on file. ? ?Signed:  Crissie Sickles, MD           09/12/2021 ? ? ? ?

## 2021-10-13 ENCOUNTER — Encounter: Payer: Medicare Other | Admitting: Dermatology

## 2022-01-21 DIAGNOSIS — H903 Sensorineural hearing loss, bilateral: Secondary | ICD-10-CM | POA: Diagnosis not present

## 2022-02-26 ENCOUNTER — Encounter: Payer: Self-pay | Admitting: Dermatology

## 2022-02-26 ENCOUNTER — Ambulatory Visit: Payer: Medicare Other | Admitting: Dermatology

## 2022-02-26 DIAGNOSIS — D2239 Melanocytic nevi of other parts of face: Secondary | ICD-10-CM | POA: Diagnosis not present

## 2022-02-26 DIAGNOSIS — D229 Melanocytic nevi, unspecified: Secondary | ICD-10-CM

## 2022-02-26 DIAGNOSIS — Z1283 Encounter for screening for malignant neoplasm of skin: Secondary | ICD-10-CM | POA: Diagnosis not present

## 2022-02-26 DIAGNOSIS — L821 Other seborrheic keratosis: Secondary | ICD-10-CM

## 2022-02-26 DIAGNOSIS — L570 Actinic keratosis: Secondary | ICD-10-CM | POA: Diagnosis not present

## 2022-02-26 DIAGNOSIS — D492 Neoplasm of unspecified behavior of bone, soft tissue, and skin: Secondary | ICD-10-CM | POA: Diagnosis not present

## 2022-02-26 DIAGNOSIS — L578 Other skin changes due to chronic exposure to nonionizing radiation: Secondary | ICD-10-CM | POA: Diagnosis not present

## 2022-02-26 DIAGNOSIS — D1801 Hemangioma of skin and subcutaneous tissue: Secondary | ICD-10-CM

## 2022-02-26 DIAGNOSIS — L814 Other melanin hyperpigmentation: Secondary | ICD-10-CM

## 2022-02-26 DIAGNOSIS — Z85828 Personal history of other malignant neoplasm of skin: Secondary | ICD-10-CM

## 2022-02-26 NOTE — Patient Instructions (Signed)
Due to recent changes in healthcare laws, you may see results of your pathology and/or laboratory studies on MyChart before the doctors have had a chance to review them. We understand that in some cases there may be results that are confusing or concerning to you. Please understand that not all results are received at the same time and often the doctors may need to interpret multiple results in order to provide you with the best plan of care or course of treatment. Therefore, we ask that you please give us 2 business days to thoroughly review all your results before contacting the office for clarification. Should we see a critical lab result, you will be contacted sooner.   If You Need Anything After Your Visit  If you have any questions or concerns for your doctor, please call our main line at [phone] and press option 4 to reach your doctor's medical assistant. If no one answers, please leave a voicemail as directed and we will return your call as soon as possible. Messages left after 4 pm will be answered the following business day.   You may also send us a message via MyChart. We typically respond to MyChart messages within 1-2 business days.  For prescription refills, please ask your pharmacy to contact our office. Our fax number is [phone].  If you have an urgent issue when the clinic is closed that cannot wait until the next business day, you can page your doctor at the number below.    Please note that while we do our best to be available for urgent issues outside of office hours, we are not available 24/7.   If you have an urgent issue and are unable to reach us, you may choose to seek medical care at your doctor's office, retail clinic, urgent care center, or emergency room.  If you have a medical emergency, please immediately call 911 or go to the emergency department.  Pager Numbers  - Dr. Kowalski: [phone]  - Dr. Moye: [phone]  - Dr. Stewart:  [phone]  In the event of inclement weather, please call our main line at [phone] for an update on the status of any delays or closures.  Dermatology Medication Tips: Please keep the boxes that topical medications come in in order to help keep track of the instructions about where and how to use these. Pharmacies typically print the medication instructions only on the boxes and not directly on the medication tubes.   If your medication is too expensive, please contact our office at [phone] option 4 or send us a message through MyChart.   We are unable to tell what your co-pay for medications will be in advance as this is different depending on your insurance coverage. However, we may be able to find a substitute medication at lower cost or fill out paperwork to get insurance to cover a needed medication.   If a prior authorization is required to get your medication covered by your insurance company, please allow us 1-2 business days to complete this process.  Drug prices often vary depending on where the prescription is filled and some pharmacies may offer cheaper prices.  The website www.goodrx.com contains coupons for medications through different pharmacies. The prices here do not account for what the cost may be with help from insurance (it may be cheaper with your insurance), but the website can give you the price if you did not use any insurance.  - You can print the associated coupon and take it with   your prescription to the pharmacy.  - You may also stop by our office during regular business hours and pick up a GoodRx coupon card.  - If you need your prescription sent electronically to a different pharmacy, notify our office through [facility] MyChart or by phone at [phone] option 4.     Si Usted Necesita Algo Despus de Su Visita  Tambin puede enviarnos un mensaje a travs de MyChart. Por lo general respondemos a los mensajes de MyChart en el transcurso de 1 a 2  das hbiles.  Para renovar recetas, por favor pida a su farmacia que se ponga en contacto con nuestra oficina. Nuestro nmero de fax es el [phone].  Si tiene un asunto urgente cuando la clnica est cerrada y que no puede esperar hasta el siguiente da hbil, puede llamar/localizar a su doctor(a) al nmero que aparece a continuacin.   Por favor, tenga en cuenta que aunque hacemos todo lo posible para estar disponibles para asuntos urgentes fuera del horario de oficina, no estamos disponibles las 24 horas del da, los 7 das de la semana.   Si tiene un problema urgente y no puede comunicarse con nosotros, puede optar por buscar atencin mdica  en el consultorio de su doctor(a), en una clnica privada, en un centro de atencin urgente o en una sala de emergencias.  Si tiene una emergencia mdica, por favor llame inmediatamente al 911 o vaya a la sala de emergencias.  Nmeros de bper  - Dr. Kowalski: [phone]  - Dra. Moye: [phone]  - Dra. Stewart: [phone]  En caso de inclemencias del tiempo, por favor llame a nuestra lnea principal al [phone] para una actualizacin sobre el estado de cualquier retraso o cierre.  Consejos para la medicacin en dermatologa: Por favor, guarde las cajas en las que vienen los medicamentos de uso tpico para ayudarle a seguir las instrucciones sobre dnde y cmo usarlos. Las farmacias generalmente imprimen las instrucciones del medicamento slo en las cajas y no directamente en los tubos del medicamento.   Si su medicamento es muy caro, por favor, pngase en contacto con nuestra oficina llamando al [phone] y presione la opcin 4 o envenos un mensaje a travs de MyChart.   No podemos decirle cul ser su copago por los medicamentos por adelantado ya que esto es diferente dependiendo de la cobertura de su seguro. Sin embargo, es posible que podamos encontrar un medicamento sustituto a menor costo o llenar un formulario para que el  seguro cubra el medicamento que se considera necesario.   Si se requiere una autorizacin previa para que su compaa de seguros cubra su medicamento, por favor permtanos de 1 a 2 das hbiles para completar este proceso.  Los precios de los medicamentos varan con frecuencia dependiendo del lugar de dnde se surte la receta y alguna farmacias pueden ofrecer precios ms baratos.  El sitio web www.goodrx.com tiene cupones para medicamentos de diferentes farmacias. Los precios aqu no tienen en cuenta lo que podra costar con la ayuda del seguro (puede ser ms barato con su seguro), pero el sitio web puede darle el precio si no utiliz ningn seguro.  - Puede imprimir el cupn correspondiente y llevarlo con su receta a la farmacia.  - Tambin puede pasar por nuestra oficina durante el horario de atencin regular y recoger una tarjeta de cupones de GoodRx.  - Si necesita que su receta se enve electrnicamente a una farmacia diferente, informe a nuestra oficina a travs de MyChart de [facility]   o por telfono llamando al [phone] y presione la opcin 4.  

## 2022-02-26 NOTE — Progress Notes (Signed)
Follow-Up Visit   Subjective  Matthew [name] is a 76 y.o. male who presents for the following: Annual Exam (Mole check). The patient presents for Total-Body Skin Exam (TBSE) for skin cancer screening and mole check.  The patient has spots, moles and lesions to be evaluated, some may be new or changing and the patient has concerns that these could be cancer.   The following portions of the chart were reviewed this encounter and updated as appropriate:   Tobacco  Allergies  Meds  Problems  Med Hx  Surg Hx  Fam Hx     Review of Systems:  No other skin or systemic complaints except as noted in HPI or Assessment and Plan.  Objective  Well appearing patient in no apparent distress; mood and affect are within normal limits.  A full examination was performed including scalp, head, eyes, ears, nose, lips, neck, chest, axillae, abdomen, back, buttocks, bilateral upper extremities, bilateral lower extremities, hands, feet, fingers, toes, fingernails, and toenails. All findings within normal limits unless otherwise noted below.  left cheek Erythematous thin papules/macules with gritty scale.   right medial cheek 0.6 cm pink papule at the right medial cheek       left cheek Stuck-on, waxy, tan-brown papule  --Discussed benign etiology and prognosis.       Assessment & Plan   Lentigines - Scattered tan macules - Due to sun exposure - Benign-appearing, observe - Recommend daily broad spectrum sunscreen SPF 30+ to sun-exposed areas, reapply every 2 hours as needed. - Call for any changes  Seborrheic Keratoses - Stuck-on, waxy, tan-brown papules and/or plaques  - Benign-appearing - Discussed benign etiology and prognosis. - Observe - Call for any changes  Melanocytic Nevi - Tan-brown and/or pink-flesh-colored symmetric macules and papules - Benign appearing on exam today - Observation - Call clinic for new or changing moles - Recommend daily use of broad spectrum spf  30+ sunscreen to sun-exposed areas.   Hemangiomas - Red papules - Discussed benign nature - Observe - Call for any changes  Actinic Damage - Chronic condition, secondary to cumulative UV/sun exposure - diffuse scaly erythematous macules with underlying dyspigmentation - Recommend daily broad spectrum sunscreen SPF 30+ to sun-exposed areas, reapply every 2 hours as needed.  - Staying in the shade or wearing long sleeves, sun glasses (UVA+UVB protection) and wide brim hats (4-inch brim around the entire circumference of the hat) are also recommended for sun protection.  - Call for new or changing lesions.  Skin cancer screening performed today.   AK (actinic keratosis) left cheek  Actinic keratoses are precancerous spots that appear secondary to cumulative UV radiation exposure/sun exposure over time. They are chronic with expected duration over 1 year. A portion of actinic keratoses will progress to squamous cell carcinoma of the skin. It is not possible to reliably predict which spots will progress to skin cancer and so treatment is recommended to prevent development of skin cancer.  Recommend daily broad spectrum sunscreen SPF 30+ to sun-exposed areas, reapply every 2 hours as needed.  Recommend staying in the shade or wearing long sleeves, sun glasses (UVA+UVB protection) and wide brim hats (4-inch brim around the entire circumference of the hat). Call for new or changing lesions.   Destruction of lesion - left cheek Complexity: simple   Destruction method: cryotherapy   Informed consent: discussed and consent obtained   Timeout:  patient name, date of birth, surgical site, and procedure verified Lesion destroyed using liquid nitrogen: Yes  Region frozen until ice ball extended beyond lesion: Yes   Outcome: patient tolerated procedure well with no complications   Post-procedure details: wound care instructions given    Neoplasm of skin right medial cheek right medial cheek  biopsy proven  SUPERFICIAL SUPERFICIAL PORTION PORTION OF HEMANGIOMA- 12/08/2012 observe  Nevus left medial cheek Suspect Sk vs other Benign-appearing.  Observation.  Call clinic for new or changing lesions.   Observe  Recheck at next office visit  History of Basal Cell Carcinoma of the Skin Right temple 2014 - No evidence of recurrence today - Recommend regular full body skin exams - Recommend daily broad spectrum sunscreen SPF 30+ to sun-exposed areas, reapply every 2 hours as needed.  - Call if any new or changing lesions are noted between office visits   Return in about 1 year (around 02/27/2023) for TBSE, hx of BCC .  IMarye Round, CMA, am acting as scribe for Sarina Ser, MD .  Documentation: I have reviewed the above documentation for accuracy and completeness, and I agree with the above.  Sarina Ser, MD

## 2022-03-20 ENCOUNTER — Encounter: Payer: Self-pay | Admitting: Family Medicine

## 2022-03-20 ENCOUNTER — Ambulatory Visit (INDEPENDENT_AMBULATORY_CARE_PROVIDER_SITE_OTHER): Payer: Medicare Other | Admitting: Family Medicine

## 2022-03-20 VITALS — BP 123/80 | HR 70 | Temp 98.1°F | Ht 69.75 in | Wt 194.8 lb

## 2022-03-20 DIAGNOSIS — R0989 Other specified symptoms and signs involving the circulatory and respiratory systems: Secondary | ICD-10-CM | POA: Diagnosis not present

## 2022-03-20 DIAGNOSIS — J209 Acute bronchitis, unspecified: Secondary | ICD-10-CM

## 2022-03-20 DIAGNOSIS — R5383 Other fatigue: Secondary | ICD-10-CM

## 2022-03-20 DIAGNOSIS — J069 Acute upper respiratory infection, unspecified: Secondary | ICD-10-CM

## 2022-03-20 LAB — POCT INFLUENZA A/B
Influenza A, POC: NEGATIVE
Influenza B, POC: NEGATIVE

## 2022-03-20 LAB — POC COVID19 BINAXNOW: SARS Coronavirus 2 Ag: NEGATIVE

## 2022-03-20 MED ORDER — PREDNISONE 20 MG PO TABS: ORAL_TABLET | ORAL | 0 refills | Status: DC

## 2022-03-20 MED ORDER — ALBUTEROL SULFATE HFA 108 (90 BASE) MCG/ACT IN AERS: 2.0000 | INHALATION_SPRAY | Freq: Four times a day (QID) | RESPIRATORY_TRACT | 0 refills | Status: DC | PRN

## 2022-03-20 NOTE — Progress Notes (Signed)
OFFICE VISIT  03/20/2022  CC:  Chief Complaint  Patient presents with   Nasal congestion   chest congestion   Fatigue   Patient is a 76 y.o. male who presents for "sinus congestion".  HPI: Onset 3 days ago of sore throat and nasal congestion with postnasal drip.  Sore throat resolved and he started getting a lot of coughing.  When he takes a deep breath it induces a coughing fit.  He denies feeling short of breath.  No fever.  Appetite is down but no nausea or vomiting or diarrhea.  Mild body aches.  Mild fatigue.  Wife with similar symptoms lately. Home covid test yesterday NEG.  Past Medical History:  Diagnosis Date   Actinic keratosis    Allergic rhinitis    Basal cell carcinoma 12/08/2012   R temple - excision 01/18/13   BRBPR (bright red blood per rectum)    Int hemorrhoids   Family history of colon cancer in father    Hearing impairment    Noise exposure   History of diverticulitis of colon 03/2014   F/u CT showed resolution.   Hyperlipemia, mixed 03/2016   He declined statin 03/2016.  Mod high trigs 04/2020, recommended fenofibrate->he declined and these improved with TLC. Then I recommended statin again 07/2020.   Iron deficiency anemia 03/2016; 03/2017   Hemoccults x 3 neg 05/2016.  Pt never took iron.  03/2017 iron still very low, mild anemia--hemoccults x 3 pos and I recommended pt start ferrous sulfate and arrange f/u with his GI MD.  Noncompliant with iron--saw GI MD 04/2017 and EGD showed esophagitis (o/w bx neg) likely causing his IDA.  Needs PPI bid x 62mo then qd lifetime, plus oral iron replacement. iron still low 04/2019 b/c noncomplianc   Prostatitis    Undescended left testicle    suspected to be atrophic    Past Surgical History:  Procedure Laterality Date   COLONOSCOPY  age 76 then again 09/2014   [place]   ESOPHAGOGASTRODUODENOSCOPY  04/2017   Dr. GKizzie Ide likely responsible for his IDA. (otherwise, bx was NEG).    INGUINAL HERNIA REPAIR     1 as infant, one [place] 157s [place]   benign per pt report    Outpatient Medications Prior to Visit  Medication Sig Dispense Refill   Ferrous Sulfate Dried 200 (65 Fe) MG TABS Take by mouth daily.     Multiple Vitamins-Minerals (ZINC PO) Take by mouth daily.     No facility-administered medications prior to visit.    Allergies  Allergen Reactions   Sulfa Antibiotics Other (See Comments)    "sick"    ROS As per HPI  PE:    03/20/2022   10:56 AM 09/12/2021    1:11 PM 09/10/2021    3:04 PM  Vitals with BMI  Height 5' 9.75" 5' 9.75" 5' 9.75"  Weight 194 lbs 13 oz 195 lbs 13 oz 194 lbs 10 oz  BMI 28.14 [id number] Systolic [id number] Diastolic 80 95 92  Pulse 70 62 98  02 sat 91% RA today  Physical Exam  VS: noted--normal. Gen: alert, NAD, NONTOXIC APPEARING. HEENT: eyes without injection, drainage, or swelling.  Ears: EACs clear, TMs with normal light reflex and landmarks.  Nose: Clear rhinorrhea, with some dried, crusty exudate adherent to mildly injected mucosa.  No purulent d/c.  No paranasal sinus TTP.  No facial swelling.  Throat and  mouth without focal lesion.  No pharyngial swelling, erythema, or exudate.   Neck: supple, no LAD.   LUNGS: Nonlabored respirations.  Aeration is normal on inspiration and expiration but there is diffuse wheezing that is mild and coarse.  He has some rhonchi on inspiration but these clear with forceful coughing. CV: RRR, no m/r/g. EXT: no c/c/e SKIN: no rash   LABS:  Last CBC Lab Results  Component Value Date   WBC 5.0 04/18/2021   HGB 14.3 04/18/2021   HCT 42.4 04/18/2021   MCV 92.4 04/18/2021   RDW 13.8 04/18/2021   PLT 214.0 54/65/6812   Last metabolic panel Lab Results  Component Value Date   GLUCOSE 92 04/18/2021   NA 140 04/18/2021   K 4.4 04/18/2021   CL 104 04/18/2021   CO2 28 04/18/2021   BUN 22 04/18/2021   CREATININE 0.99 04/18/2021   CALCIUM 9.5  04/18/2021   PROT 6.3 04/18/2021   ALBUMIN 4.4 04/18/2021   BILITOT 0.5 04/18/2021   ALKPHOS 49 04/18/2021   AST 15 04/18/2021   ALT 10 04/18/2021   IMPRESSION AND PLAN:  URI with acute asthmatic bronchitis. Viral etiology suspected. Rapid covid and flu today are NEG  Plan: Albuterol inhaler 2 puffs every 4 hours as needed. Prednisone 40 mg a day x5 days then 20 mg a day x5 days.  An After Visit Summary was printed and given to the patient.  FOLLOW UP: No follow-ups on file.  Signed:  Crissie Sickles, MD           03/20/2022

## 2022-04-20 NOTE — Patient Instructions (Signed)
Health Maintenance, Male Adopting a healthy lifestyle and getting preventive care are important in promoting health and wellness. Ask your health care provider about: The right schedule for you to have regular tests and exams. Things you can do on your own to prevent diseases and keep yourself healthy. What should I know about diet, weight, and exercise? Eat a healthy diet  Eat a diet that includes plenty of vegetables, fruits, low-fat dairy products, and lean protein. Do not eat a lot of foods that are high in solid fats, added sugars, or sodium. Maintain a healthy weight Body mass index (BMI) is a measurement that can be used to identify possible weight problems. It estimates body fat based on height and weight. Your health care provider can help determine your BMI and help you achieve or maintain a healthy weight. Get regular exercise Get regular exercise. This is one of the most important things you can do for your health. Most adults should: Exercise for at least 150 minutes each week. The exercise should increase your heart rate and make you sweat (moderate-intensity exercise). Do strengthening exercises at least twice a week. This is in addition to the moderate-intensity exercise. Spend less time sitting. Even light physical activity can be beneficial. Watch cholesterol and blood lipids Have your blood tested for lipids and cholesterol at 77 years of age, then have this test every 5 years. You may need to have your cholesterol levels checked more often if: Your lipid or cholesterol levels are high. You are older than 77 years of age. You are at high risk for heart disease. What should I know about cancer screening? Many types of cancers can be detected early and may often be prevented. Depending on your health history and family history, you may need to have cancer screening at various ages. This may include screening for: Colorectal cancer. Prostate cancer. Skin cancer. Lung  cancer. What should I know about heart disease, diabetes, and high blood pressure? Blood pressure and heart disease High blood pressure causes heart disease and increases the risk of stroke. This is more likely to develop in people who have high blood pressure readings or are overweight. Talk with your health care provider about your target blood pressure readings. Have your blood pressure checked: Every 3-5 years if you are 18-39 years of age. Every year if you are 40 years old or older. If you are between the ages of 65 and 75 and are a current or former smoker, ask your health care provider if you should have a one-time screening for abdominal aortic aneurysm (AAA). Diabetes Have regular diabetes screenings. This checks your fasting blood sugar level. Have the screening done: Once every three years after age 45 if you are at a normal weight and have a low risk for diabetes. More often and at a younger age if you are overweight or have a high risk for diabetes. What should I know about preventing infection? Hepatitis B If you have a higher risk for hepatitis B, you should be screened for this virus. Talk with your health care provider to find out if you are at risk for hepatitis B infection. Hepatitis C Blood testing is recommended for: Everyone born from 1945 through 1965. Anyone with known risk factors for hepatitis C. Sexually transmitted infections (STIs) You should be screened each year for STIs, including gonorrhea and chlamydia, if: You are sexually active and are younger than 77 years of age. You are older than 77 years of age and your   health care provider tells you that you are at risk for this type of infection. Your sexual activity has changed since you were last screened, and you are at increased risk for chlamydia or gonorrhea. Ask your health care provider if you are at risk. Ask your health care provider about whether you are at high risk for HIV. Your health care provider  may recommend a prescription medicine to help prevent HIV infection. If you choose to take medicine to prevent HIV, you should first get tested for HIV. You should then be tested every 3 months for as long as you are taking the medicine. Follow these instructions at home: Alcohol use Do not drink alcohol if your health care provider tells you not to drink. If you drink alcohol: Limit how much you have to 0-2 drinks a day. Know how much alcohol is in your drink. In the [place] one drink equals one 12 oz bottle of beer (355 mL), one 5 oz glass of wine (148 mL), or one 1 oz glass of hard liquor (44 mL). Lifestyle Do not use any products that contain nicotine or tobacco. These products include cigarettes, chewing tobacco, and vaping devices, such as e-cigarettes. If you need help quitting, ask your health care provider. Do not use street drugs. Do not share needles. Ask your health care provider for help if you need support or information about quitting drugs. General instructions Schedule regular health, dental, and eye exams. Stay current with your vaccines. Tell your health care provider if: You often feel depressed. You have ever been abused or do not feel safe at home. Summary Adopting a healthy lifestyle and getting preventive care are important in promoting health and wellness. Follow your health care provider's instructions about healthy diet, exercising, and getting tested or screened for diseases. Follow your health care provider's instructions on monitoring your cholesterol and blood pressure. This information is not intended to replace advice given to you by your health care provider. Make sure you discuss any questions you have with your health care provider. Document Revised: 10/07/2020 Document Reviewed: 10/07/2020 Elsevier Patient Education  2023 [employer]  

## 2022-04-28 ENCOUNTER — Ambulatory Visit (INDEPENDENT_AMBULATORY_CARE_PROVIDER_SITE_OTHER): Payer: Medicare Other | Admitting: Family Medicine

## 2022-04-28 ENCOUNTER — Encounter: Payer: Self-pay | Admitting: Family Medicine

## 2022-04-28 VITALS — BP 118/71 | HR 58 | Temp 98.6°F | Ht 69.5 in | Wt 199.0 lb

## 2022-04-28 DIAGNOSIS — Z125 Encounter for screening for malignant neoplasm of prostate: Secondary | ICD-10-CM | POA: Diagnosis not present

## 2022-04-28 DIAGNOSIS — Z862 Personal history of diseases of the blood and blood-forming organs and certain disorders involving the immune mechanism: Secondary | ICD-10-CM | POA: Diagnosis not present

## 2022-04-28 DIAGNOSIS — Z0000 Encounter for general adult medical examination without abnormal findings: Secondary | ICD-10-CM | POA: Diagnosis not present

## 2022-04-28 DIAGNOSIS — Z23 Encounter for immunization: Secondary | ICD-10-CM

## 2022-04-28 LAB — CBC
HCT: 42.2 % (ref 39.0–52.0)
Hemoglobin: 14.5 g/dL (ref 13.0–17.0)
MCHC: 34.3 g/dL (ref 30.0–36.0)
MCV: 92.3 fl (ref 78.0–100.0)
Platelets: 266 10*3/uL (ref 150.0–400.0)
RBC: 4.57 Mil/uL (ref 4.22–5.81)
RDW: 13.4 % (ref 11.5–15.5)
WBC: 5.7 10*3/uL (ref 4.0–10.5)

## 2022-04-28 LAB — COMPREHENSIVE METABOLIC PANEL
ALT: 11 U/L (ref 0–53)
AST: 17 U/L (ref 0–37)
Albumin: 4.3 g/dL (ref 3.5–5.2)
Alkaline Phosphatase: 45 U/L (ref 39–117)
BUN: 23 mg/dL (ref 6–23)
CO2: 30 mEq/L (ref 19–32)
Calcium: 9.4 mg/dL (ref 8.4–10.5)
Chloride: 104 mEq/L (ref 96–112)
Creatinine, Ser: 0.99 mg/dL (ref 0.40–1.50)
GFR: 73.46 mL/min (ref >60.00)
Glucose, Bld: 91 mg/dL (ref 70–99)
Potassium: 5 mEq/L (ref 3.5–5.1)
Sodium: 141 mEq/L (ref 135–145)
Total Bilirubin: 0.5 mg/dL (ref 0.2–1.2)
Total Protein: 6.1 g/dL (ref 6.0–8.3)

## 2022-04-28 LAB — LIPID PANEL
Cholesterol: 245 mg/dL — ABNORMAL HIGH (ref 0–200)
HDL: 47.4 mg/dL (ref >39.00)
NonHDL: 197.34
Total CHOL/HDL Ratio: 5
Triglycerides: 323 mg/dL — ABNORMAL HIGH (ref 0.0–149.0)
VLDL: 64.6 mg/dL — ABNORMAL HIGH (ref 0.0–40.0)

## 2022-04-28 LAB — PSA, MEDICARE: PSA: 1.73 ng/ml (ref 0.10–4.00)

## 2022-04-28 LAB — LDL CHOLESTEROL, DIRECT: Direct LDL: 141 mg/dL

## 2022-04-28 MED ORDER — SHINGRIX 50 MCG/0.5ML IM SUSR: 0.5000 mL | Freq: Once | INTRAMUSCULAR | 1 refills | Status: AC

## 2022-04-28 NOTE — Progress Notes (Signed)
Office Note 04/28/2022  CC:  Chief Complaint  Patient presents with   Annual Exam    Pt is fasting     HPI:  Patient is a 76 y.o. male who is here for annual health maintenance exam. Matthew [name] is feeling well. Recent bronchitis illness is nearly completely resolved, just a little phlegm in the back of his throat in the mornings.  Past Medical History:  Diagnosis Date   Actinic keratosis    Allergic rhinitis    Basal cell carcinoma 12/08/2012   R temple - excision 01/18/13   BRBPR (bright red blood per rectum)    Int hemorrhoids   Family history of colon cancer in father    Hearing impairment    Noise exposure   History of diverticulitis of colon 03/2014   F/u CT showed resolution.   Hyperlipemia, mixed 03/2016   He declined statin 03/2016.  Mod high trigs 04/2020, recommended fenofibrate->he declined and these improved with TLC. Then I recommended statin again 07/2020.   Iron deficiency anemia 03/2016; 03/2017   Hemoccults x 3 neg 05/2016.  Pt never took iron.  03/2017 iron still very low, mild anemia--hemoccults x 3 pos and I recommended pt start ferrous sulfate and arrange f/u with his GI MD.  Noncompliant with iron--saw GI MD 04/2017 and EGD showed esophagitis (o/w bx neg) likely causing his IDA.  Needs PPI bid x 46mo then qd lifetime, plus oral iron replacement. iron still low 04/2019 b/c noncomplianc   Prostatitis    Undescended left testicle    suspected to be atrophic    Past Surgical History:  Procedure Laterality Date   COLONOSCOPY  age 76 then again 09/2014   [place] [facility]   ESOPHAGOGASTRODUODENOSCOPY  04/2017   Dr. GKizzie Ide likely responsible for his IDA. (otherwise, bx was NEG).   INGUINAL HERNIA REPAIR     1 as infant, one in 163s 1 in [place] ?2004   benign per pt report    Family History  Problem Relation Age of Onset   Cancer Father        colon and lung    Social History   Socioeconomic  History   Marital status: Married    Spouse name: Not on file   Number of children: Not on file   Years of education: Not on file   Highest education level: Not on file  Occupational History   Not on file  Tobacco Use   Smoking status: Former   Smokeless tobacco: Former    Types: Chew  Substance and Sexual Activity   Alcohol use: Yes   Drug use: No   Sexual activity: Not on file  Other Topics Concern   Not on file  Social History Narrative   Married, 1 son.   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   Social Determinants of Health   Financial Resource Strain: Low Risk  (08/09/2021)   Overall Financial Resource Strain (CARDIA)    Difficulty of Paying Living Expenses: Not hard at all  Food Insecurity: No Food Insecurity (08/09/2021)   Hunger Vital Sign    Worried About Running Out of Food in the Last Year: Never true    Ran Out of Food in the Last Year: Never true  Transportation Needs: No Transportation Needs (08/09/2021)   PRAPARE - Transportation    Lack of Transportation (Medical): No    Lack of  Transportation (Non-Medical): No  Physical Activity: Sufficiently Active (08/09/2021)   Exercise Vital Sign    Days of Exercise per Week: 6 days    Minutes of Exercise per Session: 30 min  Stress: No Stress Concern Present (08/09/2021)   [place]    Feeling of Stress : Not at all  Social Connections: [place] (08/09/2021)   Social Connection and Isolation Panel [NHANES]    Frequency of Communication with Friends and Family: More than three times a week    Frequency of Social Gatherings with Friends and Family: More than three times a week    Attends Religious Services: More than 4 times per year    Active Member of [employer] or Organizations: Yes    Attends Music therapist: More than 4 times per year    Marital Status: Married  Arboriculturist Violence: Not At Risk (08/09/2021)   Humiliation, Afraid, Rape, and Kick questionnaire    Fear of Current or Ex-Partner: No    Emotionally Abused: No    Physically Abused: No    Sexually Abused: No    Outpatient Medications Prior to Visit  Medication Sig Dispense Refill   albuterol (VENTOLIN HFA) 108 (90 Base) MCG/ACT inhaler Inhale 2 puffs into the lungs every 6 (six) hours as needed for wheezing or shortness of breath. 8 g 0   Ferrous Sulfate Dried 200 (65 Fe) MG TABS Take by mouth daily.     Multiple Vitamins-Minerals (ZINC PO) Take by mouth daily.     predniSONE (DELTASONE) 20 MG tablet 2 tabs po qd x 5d then 1 tab po qd x 5d 15 tablet 0   No facility-administered medications prior to visit.    Allergies  Allergen Reactions   Sulfa Antibiotics Other (See Comments)    "sick"    ROS Review of Systems  Constitutional:  Negative for appetite change, chills, fatigue and fever.  HENT:  Negative for congestion, dental problem, ear pain and sore throat.   Eyes:  Negative for discharge, redness and visual disturbance.  Respiratory:  Negative for cough, chest tightness, shortness of breath and wheezing.   Cardiovascular:  Negative for chest pain, palpitations and leg swelling.  Gastrointestinal:  Negative for abdominal pain, blood in stool, diarrhea, nausea and vomiting.  Genitourinary:  Negative for difficulty urinating, dysuria, flank pain, frequency, hematuria and urgency.  Musculoskeletal:  Negative for arthralgias, back pain, joint swelling, myalgias and neck stiffness.  Skin:  Negative for pallor and rash.  Neurological:  Negative for dizziness, speech difficulty, weakness and headaches.  Hematological:  Negative for adenopathy. Does not bruise/bleed easily.  Psychiatric/Behavioral:  Negative for confusion and sleep disturbance. The patient is not nervous/anxious.     PE;    04/28/2022    9:47 AM 03/20/2022   10:56 AM 09/12/2021    1:11 PM  Vitals with BMI  Height  5' 9.5" 5' 9.75" 5' 9.75"  Weight 199 lbs 194 lbs 13 oz 195 lbs 13 oz  BMI 28.98 84.13 24.40  Systolic 102 725 366  Diastolic 71 80 95  Pulse 58 70 62    Gen: Alert, well appearing.  Patient is oriented to person, place, time, and situation. AFFECT: pleasant, lucid thought and speech. ENT: Ears: EACs clear, normal epithelium.  TMs with good light reflex and landmarks bilaterally.  Eyes: no injection, icteris, swelling, or exudate.  EOMI, PERRLA. Nose: no drainage or turbinate edema/swelling.  No injection or focal lesion.  Mouth: lips without lesion/swelling.  Oral mucosa pink and moist.  Dentition intact and without obvious caries or gingival swelling.  Oropharynx without erythema, exudate, or swelling.  Neck: supple/nontender.  No LAD, mass, or TM.  Carotid pulses 2+ bilaterally, without bruits. CV: RRR, no m/r/g.   LUNGS: CTA bilat, nonlabored resps, good aeration in all lung fields. ABD: soft, NT, ND, BS normal.  No hepatospenomegaly or mass.  No bruits. EXT: no clubbing, cyanosis, or edema.  Musculoskeletal: no joint swelling, erythema, warmth, or tenderness.  ROM of all joints intact. Skin - no sores or suspicious lesions or rashes or color changes  Pertinent labs:   Lab Results  Component Value Date   WBC 5.0 04/18/2021   HGB 14.3 04/18/2021   HCT 42.4 04/18/2021   MCV 92.4 04/18/2021   PLT 214.0 04/18/2021   Lab Results  Component Value Date   IRON 95 04/18/2021   TIBC 379 04/18/2021   FERRITIN 27 04/18/2021   Lab Results  Component Value Date   CREATININE 0.99 04/18/2021   BUN 22 04/18/2021   NA 140 04/18/2021   K 4.4 04/18/2021   CL 104 04/18/2021   CO2 28 04/18/2021   Lab Results  Component Value Date   ALT 10 04/18/2021   AST 15 04/18/2021   ALKPHOS 49 04/18/2021   BILITOT 0.5 04/18/2021   Lab Results  Component Value Date   CHOL 217 (H) 04/18/2021   Lab Results  Component Value Date   HDL 45.90 04/18/2021   Lab Results  Component Value Date    LDLCALC 126 (H) 07/22/2020   Lab Results  Component Value Date   TRIG 203.0 (H) 04/18/2021   Lab Results  Component Value Date   CHOLHDL 5 04/18/2021   Lab Results  Component Value Date   PSA 2.58 04/18/2021   PSA 1.65 04/17/2020   PSA 1.51 03/31/2018   ASSESSMENT AND PLAN:   Health maintenance exam: Reviewed age and gender appropriate health maintenance issues (prudent diet, regular exercise, health risks of tobacco and excessive alcohol, use of seatbelts, fire alarms in home, use of sunscreen).  Also reviewed age and gender appropriate health screening as well as vaccine recommendations. Vaccines: Flu->declined.  Shingrix->declined. Labs: cbc,cmet, lipid, iron, psa. Prostate ca screening: benefits vs risks of ongoing PSA testing at this point were reviewed---> patient elected o get this test today. Colon ca screening:  Has hx of adenomatous polyps, [facility] 2019->gave pt their contact info at his cpe each of the last 2 years so he could contact them to arrange rpt--> patient has not done this yet but says he will try to make time to do this either with [facility] or with a GI he is familiar with in [place] [facility]).  An After Visit Summary was printed and given to the patient.  FOLLOW UP:  Return in about 1 year (around 04/29/2023) for annual CPE (fasting).  Signed:  Crissie Sickles, MD           04/28/2022

## 2022-04-29 ENCOUNTER — Encounter: Payer: Self-pay | Admitting: Family Medicine

## 2022-04-29 LAB — IRON,TIBC AND FERRITIN PANEL
%SAT: 32 % (calc) (ref 20–48)
Ferritin: 34 ng/mL (ref 24–380)
Iron: 118 ug/dL (ref 50–180)
TIBC: 372 mcg/dL (calc) (ref 250–425)

## 2022-08-11 ENCOUNTER — Telehealth: Payer: Self-pay | Admitting: Family Medicine

## 2022-08-11 NOTE — Telephone Encounter (Signed)
Contacted [name] to schedule their annual wellness visit. Appointment made for 08/12/2022.  [place] Direct Dial [phone]

## 2022-08-12 ENCOUNTER — Ambulatory Visit (INDEPENDENT_AMBULATORY_CARE_PROVIDER_SITE_OTHER): Payer: Medicare Other

## 2022-08-12 VITALS — Wt 199.0 lb

## 2022-08-12 DIAGNOSIS — Z0000 Encounter for general adult medical examination without abnormal findings: Secondary | ICD-10-CM | POA: Diagnosis not present

## 2022-08-12 NOTE — Progress Notes (Signed)
.I connected with  [name] on 08/12/22 by a audio enabled telemedicine application and verified that I am speaking with the correct person using two identifiers.  Patient Location: Home  Provider Location: Home Office  I discussed the limitations of evaluation and management by telemedicine. The patient expressed understanding and agreed to proceed.   Subjective:   Matthew [name] is a 77 y.o. male who presents for Medicare Annual/Subsequent preventive examination.  Review of Systems     Cardiac Risk Factors include: advanced age (>61mn, >>30women);male gender     Objective:    Today's Vitals   08/12/22 1431  Weight: 199 lb (90.3 kg)   Body mass index is 28.97 kg/m.     08/12/2022    2:35 PM 08/09/2021    9:40 AM  Advanced Directives  Does Patient Have a Medical Advance Directive? No No;Yes  Type of Advance Directive  Living will  Would patient like information on creating a medical advance directive? No - Patient declined     Current Medications (verified) Outpatient Encounter Medications as of 08/12/2022  Medication Sig   Ferrous Sulfate Dried 200 (65 Fe) MG TABS Take by mouth daily.   Multiple Vitamin (MULTIVITAMIN) tablet Take 1 tablet by mouth daily.   [DISCONTINUED] albuterol (VENTOLIN HFA) 108 (90 Base) MCG/ACT inhaler Inhale 2 puffs into the lungs every 6 (six) hours as needed for wheezing or shortness of breath.   [DISCONTINUED] Multiple Vitamins-Minerals (ZINC PO) Take by mouth daily.   No facility-administered encounter medications on file as of 08/12/2022.    Allergies (verified) Sulfa antibiotics   History: Past Medical History:  Diagnosis Date   Actinic keratosis    Allergic rhinitis    Basal cell carcinoma 12/08/2012   R temple - excision 01/18/13   BRBPR (bright red blood per rectum)    Int hemorrhoids   Family history of colon cancer in father    Hearing impairment    Noise exposure   History of diverticulitis of colon 03/2014   F/u CT  showed resolution.   Hyperlipemia, mixed 03/2016   He declined statin 03/2016.  Mod high trigs 04/2020, recommended fenofibrate->he declined and these improved with TLC. Then I recommended statin again 07/2020 and 04/2022 and he declined   Iron deficiency anemia 03/2016; 03/2017   Hemoccults x 3 neg 05/2016.  Pt never took iron.  03/2017 iron still very low, mild anemia--hemoccults x 3 pos and I recommended pt start ferrous sulfate and arrange f/u with his GI MD.  Noncompliant with iron--saw GI MD 04/2017 and EGD showed esophagitis (o/w bx neg) likely causing his IDA.  Needs PPI bid x 124mothen qd lifetime, plus oral iron replacement. iron still low 04/2019 b/c noncomplianc   Prostatitis    Undescended left testicle    suspected to be atrophic   Past Surgical History:  Procedure Laterality Date   COLONOSCOPY  age [age]then again 09/2014   Recall 09/2017 per [facility]   ESOPHAGOGASTRODUODENOSCOPY  04/2017   Dr. GrKizzie Idelikely responsible for his IDA. (otherwise, bx was NEG).   INGUINAL HERNIA REPAIR     1 as infant, one in 1924s1 in [place]?2004   benign per pt report   Family History  Problem Relation Age of Onset   Cancer Father        colon and lung   Social History   Socioeconomic History   Marital status: Married    Spouse name:  Not on file   Number of children: Not on file   Years of education: Not on file   Highest education level: Not on file  Occupational History   Not on file  Tobacco Use   Smoking status: Former   Smokeless tobacco: Former    Types: Chew  Substance and Sexual Activity   Alcohol use: Yes   Drug use: No   Sexual activity: Not on file  Other Topics Concern   Not on file  Social History Narrative   Married, 1 son.   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   No tobacco, rare alcohol, no drugs.   Social Determinants of Health   Financial Resource Strain: Low Risk  (08/12/2022)   Overall  Financial Resource Strain (CARDIA)    Difficulty of Paying Living Expenses: Not hard at all  Food Insecurity: No Food Insecurity (08/12/2022)   Hunger Vital Sign    Worried About Running Out of Food in the Last Year: Never true    Ran Out of Food in the Last Year: Never true  Transportation Needs: No Transportation Needs (08/12/2022)   PRAPARE - Hydrologist (Medical): No    Lack of Transportation (Non-Medical): No  Physical Activity: Sufficiently Active (08/12/2022)   Exercise Vital Sign    Days of Exercise per Week: 6 days    Minutes of Exercise per Session: 60 min  Stress: No Stress Concern Present (08/12/2022)   [place]    Feeling of Stress : Not at all  Social Connections: [place] (08/12/2022)   Social Connection and Isolation Panel [NHANES]    Frequency of Communication with Friends and Family: More than three times a week    Frequency of Social Gatherings with Friends and Family: More than three times a week    Attends Religious Services: More than 4 times per year    Active Member of [employer] or Organizations: Yes    Attends Archivist Meetings: 1 to 4 times per year    Marital Status: Married    Tobacco Counseling Counseling given: Not Answered   Clinical Intake:  Pre-visit preparation completed: Yes  Pain : No/denies pain     BMI - recorded: 28.97 Nutritional Status: BMI 25 -29 Overweight Nutritional Risks: None Diabetes: No  How often do you need to have someone help you when you read instructions, pamphlets, or other written materials from your doctor or pharmacy?: 1 - Never  Diabetic?no  Interpreter Needed?: No  Information entered by :: Charlott Rakes, LPN   Activities of Daily Living    08/12/2022    2:36 PM 06/05/2022   10:41 AM  In your present state of health, do you have any difficulty performing the following activities:  Hearing? 1  1  Comment wears hearing aids   Vision? 0 0  Difficulty concentrating or making decisions? 0 0  Walking or climbing stairs? 0 0  Dressing or bathing? 0 0  Doing errands, shopping? 0 0  Preparing Food and eating ? N N  Using the Toilet? N N  In the past six months, have you accidently leaked urine? N N  Do you have problems with loss of bowel control? N N  Managing your Medications? N N  Managing your Finances? N N  Housekeeping or managing your Housekeeping? N N    Patient Care Team: Tammi Sou, MD as PCP - General (Family  Medicine) Thelma Comp, MD as Consulting Physician (Gastroenterology) Ralene Bathe, MD as Consulting Physician (Dermatology) Coral Spikes, MD as Consulting Physician (Gastroenterology)  Indicate any recent Medical Services you may have received from other than [facility] providers in the past year (date may be approximate).     Assessment:   This is a routine wellness examination for Nav[place].  Hearing/Vision screen Hearing Screening - Comments:: Pt wears hearing aids  Vision Screening - Comments:: Pt follows up with Dr Sherlean Foot for annual eye exams   Dietary issues and exercise activities discussed: Current Exercise Habits: Home exercise routine, Type of exercise: walking;Other - see comments, Time (Minutes): 60, Frequency (Times/Week): 6, Weekly Exercise (Minutes/Week): 360   Goals Addressed             This Visit's Progress    Patient Stated       Maintain weight        Depression Screen    08/12/2022    2:34 PM 04/28/2022    9:49 AM 08/09/2021    9:36 AM 04/18/2021    8:01 AM 04/17/2020    2:30 PM 04/05/2019    7:59 AM 03/31/2018    8:06 AM  PHQ 2/9 Scores  PHQ - 2 Score 0 0 0 0 0 0 0    Fall Risk    08/12/2022    2:36 PM 06/05/2022   10:41 AM 04/28/2022    9:50 AM 08/09/2021    9:40 AM 04/18/2021    8:01 AM  [place] in the past year? 0 0 0 0 0  Number falls in past yr: 0 0 0 0 0  Injury with Fall? 0 0 0 0 0  Risk for  fall due to : Impaired vision  No Fall Risks History of fall(s)   Follow up Falls prevention discussed  Falls evaluation completed Falls evaluation completed Falls evaluation completed    FALL RISK PREVENTION PERTAINING TO THE HOME:  Any stairs in or around the home? Yes  If so, are there any without handrails? No  Home free of loose throw rugs in walkways, pet beds, electrical cords, etc? Yes  Adequate lighting in your home to reduce risk of falls? Yes   ASSISTIVE DEVICES UTILIZED TO PREVENT FALLS:  Life alert? Yes  Use of a cane, walker or w/c? No  Grab bars in the bathroom? Yes  Shower chair or bench in shower? Yes  Elevated toilet seat or a handicapped toilet? No   TIMED UP AND GO:  Was the test performed? No .   Cognitive Function:        08/12/2022    2:36 PM 08/09/2021    9:41 AM  6CIT Screen  What Year? 0 points 0 points  What month? 0 points 0 points  What time? 0 points 0 points  Count back from 20 0 points 0 points  Months in reverse 0 points 0 points  Repeat phrase 0 points 0 points  Total Score 0 points 0 points    Immunizations Immunization History  Administered Date(s) Administered   Fluad Quad(high Dose 65+) 04/05/2019   Influenza, High Dose Seasonal PF 03/26/2015, 03/18/2016, 03/30/2017, 03/31/2018   Influenza,inj,Quad PF,6+ Mos 03/29/2014   Moderna Sars-Covid-2 Vaccination 10/26/2019, 11/23/2019   Pneumococcal Conjugate-13 03/18/2016   Pneumococcal Polysaccharide-23 03/30/2017   Tdap 09/10/2021    TDAP status: Up to date  Flu Vaccine status: Declined, Education has been provided regarding the importance of this vaccine but patient still declined.  Advised may receive this vaccine at local pharmacy or Health Dept. Aware to provide a copy of the vaccination record if obtained from local pharmacy or Health Dept. Verbalized acceptance and understanding.  Pneumococcal vaccine status: Up to date  Covid-19 vaccine status: Completed  vaccines  Qualifies for Shingles Vaccine? Yes   Zostavax completed No   Shingrix Completed?: No.    Education has been provided regarding the importance of this vaccine. Patient has been advised to call insurance company to determine out of pocket expense if they have not yet received this vaccine. Advised may also receive vaccine at local pharmacy or Health Dept. Verbalized acceptance and understanding.  Screening Tests Health Maintenance  Topic Date Due   Zoster Vaccines- Shingrix (1 of 2) Never done   COVID-19 Vaccine (3 - Moderna risk series) 12/21/2019   INFLUENZA VACCINE  08/30/2022 (Originally 12/30/2021)   Medicare Annual Wellness (AWV)  08/12/2023   DTaP/Tdap/Td (2 - Td or Tdap) 09/11/2031   Pneumonia Vaccine 39+ Years old  Completed   Hepatitis C Screening  Completed   HPV VACCINES  Aged Out    Health Maintenance  Health Maintenance Due  Topic Date Due   Zoster Vaccines- Shingrix (1 of 2) Never done   COVID-19 Vaccine (3 - Moderna risk series) 12/21/2019    Colorectal cancer screening: No longer required.    Additional Screening:  Hepatitis C Screening:  Completed 04/05/19  Vision Screening: Recommended annual ophthalmology exams for early detection of glaucoma and other disorders of the eye. Is the patient up to date with their annual eye exam?  Yes  Who is the provider or what is the name of the office in which the patient attends annual eye exams? Dr Sherlean Foot  If pt is not established with a provider, would they like to be referred to a provider to establish care? No .   Dental Screening: Recommended annual dental exams for proper oral hygiene  Community Resource Referral / Chronic Care Management: CRR required this visit?  No   CCM required this visit?  No      Plan:     I have personally reviewed and noted the following in the patient's chart:   Medical and social history Use of alcohol, tobacco or illicit drugs  Current medications and supplements  including opioid prescriptions. Patient is not currently taking opioid prescriptions. Functional ability and status Nutritional status Physical activity Advanced directives List of other physicians Hospitalizations, surgeries, and ER visits in previous 12 months Vitals Screenings to include cognitive, depression, and falls Referrals and appointments  In addition, I have reviewed and discussed with patient certain preventive protocols, quality metrics, and best practice recommendations. A written personalized care plan for preventive services as well as general preventive health recommendations were provided to patient.     Willette Brace, LPN   [id number]   Nurse Notes: none

## 2022-08-12 NOTE — Patient Instructions (Signed)
[name] , Thank you for taking time to come for your Medicare Wellness Visit. I appreciate your ongoing commitment to your health goals. Please review the following plan we discussed and let me know if I can assist you in the future.   These are the goals we discussed:  Goals      Patient Stated     Maintain weight         This is a list of the screening recommended for you and due dates:  Health Maintenance  Topic Date Due   Zoster (Shingles) Vaccine (1 of 2) Never done   COVID-19 Vaccine (3 - Moderna risk series) 12/21/2019   Flu Shot  08/30/2022*   Medicare Annual Wellness Visit  08/12/2023   DTaP/Tdap/Td vaccine (2 - Td or Tdap) 09/11/2031   Pneumonia Vaccine  Completed   Hepatitis C Screening: USPSTF Recommendation to screen - Ages 65-79 yo.  Completed   HPV Vaccine  Aged Out  *Topic was postponed. The date shown is not the original due date.    Advanced directives: Advance directive discussed with you today. Even though you declined this today please call our office should you change your mind and we can give you the proper paperwork for you to fill out.  Conditions/risks identified: maintain weight   Next appointment: Follow up in one year for your annual wellness visit.   Preventive Care 26 Years and Older, Male  Preventive care refers to lifestyle choices and visits with your health care provider that can promote health and wellness. What does preventive care include? A yearly physical exam. This is also called an annual well check. Dental exams once or twice a year. Routine eye exams. Ask your health care provider how often you should have your eyes checked. Personal lifestyle choices, including: Daily care of your teeth and gums. Regular physical activity. Eating a healthy diet. Avoiding tobacco and drug use. Limiting alcohol use. Practicing safe sex. Taking low doses of aspirin every day. Taking vitamin and mineral supplements as recommended by your health  care provider. What happens during an annual well check? The services and screenings done by your health care provider during your annual well check will depend on your age, overall health, lifestyle risk factors, and family history of disease. Counseling  Your health care provider may ask you questions about your: Alcohol use. Tobacco use. Drug use. Emotional well-being. Home and relationship well-being. Sexual activity. Eating habits. History of falls. Memory and ability to understand (cognition). Work and work Statistician. Screening  You may have the following tests or measurements: Height, weight, and BMI. Blood pressure. Lipid and cholesterol levels. These may be checked every 5 years, or more frequently if you are over 78 years old. Skin check. Lung cancer screening. You may have this screening every year starting at age 78 if you have a 30-pack-year history of smoking and currently smoke or have quit within the past 15 years. Fecal occult blood test (FOBT) of the stool. You may have this test every year starting at age 78. Flexible sigmoidoscopy or colonoscopy. You may have a sigmoidoscopy every 5 years or a colonoscopy every 10 years starting at age 78. Prostate cancer screening. Recommendations will vary depending on your family history and other risks. Hepatitis C blood test. Hepatitis B blood test. Sexually transmitted disease (STD) testing. Diabetes screening. This is done by checking your blood sugar (glucose) after you have not eaten for a while (fasting). You may have this done every 1-3  years. Abdominal aortic aneurysm (AAA) screening. You may need this if you are a current or former smoker. Osteoporosis. You may be screened starting at age 78 if you are at high risk. Talk with your health care provider about your test results, treatment options, and if necessary, the need for more tests. Vaccines  Your health care provider may recommend certain vaccines, such  as: Influenza vaccine. This is recommended every year. Tetanus, diphtheria, and acellular pertussis (Tdap, Td) vaccine. You may need a Td booster every 10 years. Zoster vaccine. You may need this after age 78. Pneumococcal 13-valent conjugate (PCV13) vaccine. One dose is recommended after age 78. Pneumococcal polysaccharide (PPSV23) vaccine. One dose is recommended after age 78. Talk to your health care provider about which screenings and vaccines you need and how often you need them. This information is not intended to replace advice given to you by your health care provider. Make sure you discuss any questions you have with your health care provider. Document Released: 06/14/2015 Document Revised: 02/05/2016 Document Reviewed: 03/19/2015 Elsevier Interactive Patient Education  2017 [place] Prevention in the Home Falls can cause injuries. They can happen to people of all ages. There are many things you can do to make your home safe and to help prevent falls. What can I do on the outside of my home? Regularly fix the edges of walkways and driveways and fix any cracks. Remove anything that might make you trip as you walk through a door, such as a raised step or threshold. Trim any bushes or trees on the path to your home. Use bright outdoor lighting. Clear any walking paths of anything that might make someone trip, such as rocks or tools. Regularly check to see if handrails are loose or broken. Make sure that both sides of any steps have handrails. Any raised decks and porches should have guardrails on the edges. Have any leaves, snow, or ice cleared regularly. Use sand or salt on walking paths during winter. Clean up any spills in your garage right away. This includes oil or grease spills. What can I do in the bathroom? Use night lights. Install grab bars by the toilet and in the tub and shower. Do not use towel bars as grab bars. Use non-skid mats or decals in the tub or  shower. If you need to sit down in the shower, use a plastic, non-slip stool. Keep the floor dry. Clean up any water that spills on the floor as soon as it happens. Remove soap buildup in the tub or shower regularly. Attach bath mats securely with double-sided non-slip rug tape. Do not have throw rugs and other things on the floor that can make you trip. What can I do in the bedroom? Use night lights. Make sure that you have a light by your bed that is easy to reach. Do not use any sheets or blankets that are too big for your bed. They should not hang down onto the floor. Have a firm chair that has side arms. You can use this for support while you get dressed. Do not have throw rugs and other things on the floor that can make you trip. What can I do in the kitchen? Clean up any spills right away. Avoid walking on wet floors. Keep items that you use a lot in easy-to-reach places. If you need to reach something above you, use a strong step stool that has a grab bar. Keep electrical cords out of the way. Do  not use floor polish or wax that makes floors slippery. If you must use wax, use non-skid floor wax. Do not have throw rugs and other things on the floor that can make you trip. What can I do with my stairs? Do not leave any items on the stairs. Make sure that there are handrails on both sides of the stairs and use them. Fix handrails that are broken or loose. Make sure that handrails are as long as the stairways. Check any carpeting to make sure that it is firmly attached to the stairs. Fix any carpet that is loose or worn. Avoid having throw rugs at the top or bottom of the stairs. If you do have throw rugs, attach them to the floor with carpet tape. Make sure that you have a light switch at the top of the stairs and the bottom of the stairs. If you do not have them, ask someone to add them for you. What else can I do to help prevent falls? Wear shoes that: Do not have high heels. Have  rubber bottoms. Are comfortable and fit you well. Are closed at the toe. Do not wear sandals. If you use a stepladder: Make sure that it is fully opened. Do not climb a closed stepladder. Make sure that both sides of the stepladder are locked into place. Ask someone to hold it for you, if possible. Clearly mark and make sure that you can see: Any grab bars or handrails. First and last steps. Where the edge of each step is. Use tools that help you move around (mobility aids) if they are needed. These include: Canes. Walkers. Scooters. Crutches. Turn on the lights when you go into a dark area. Replace any light bulbs as soon as they burn out. Set up your furniture so you have a clear path. Avoid moving your furniture around. If any of your floors are uneven, fix them. If there are any pets around you, be aware of where they are. Review your medicines with your doctor. Some medicines can make you feel dizzy. This can increase your chance of falling. Ask your doctor what other things that you can do to help prevent falls. This information is not intended to replace advice given to you by your health care provider. Make sure you discuss any questions you have with your health care provider. Document Released: 03/14/2009 Document Revised: 10/24/2015 Document Reviewed: 06/22/2014 Elsevier Interactive Patient Education  2017 [employer].

## 2022-09-30 DIAGNOSIS — H8110 Benign paroxysmal vertigo, unspecified ear: Secondary | ICD-10-CM

## 2022-09-30 HISTORY — DX: Benign paroxysmal vertigo, unspecified ear: H81.10

## 2022-10-05 ENCOUNTER — Ambulatory Visit (INDEPENDENT_AMBULATORY_CARE_PROVIDER_SITE_OTHER): Payer: Medicare Other | Admitting: Family Medicine

## 2022-10-05 ENCOUNTER — Encounter: Payer: Self-pay | Admitting: Family Medicine

## 2022-10-05 VITALS — BP 142/88 | HR 52 | Wt 200.4 lb

## 2022-10-05 DIAGNOSIS — H8110 Benign paroxysmal vertigo, unspecified ear: Secondary | ICD-10-CM

## 2022-10-05 NOTE — Progress Notes (Signed)
OFFICE VISIT  10/05/2022  CC:  Chief Complaint  Patient presents with   Follow-up    Follow up on dizziness.    Patient is a 77 y.o. male who presents for dizziness.  HPI: 1 week ago Matthew [name] had a sensation of spinning/rocking plus nausea that started after he rolled over in bed.  The worst of the symptoms resolved and less than a minute but he remained lightheaded for a few hours.  Denied palpitations but did report breaking out in a sweat.  No chest pain, no headache, no focal weakness, no tremor, no sensory abnormalities.  No vision abnormalities, no acute hearing changes (he has chronic severe hearing loss), no difficulties with speech.  He has not had any recurrence of the symptoms since that day 1 week ago.  Past Medical History:  Diagnosis Date   Actinic keratosis    Allergic rhinitis    Basal cell carcinoma 12/08/2012   R temple - excision 01/18/13   BRBPR (bright red blood per rectum)    Int hemorrhoids   Family history of colon cancer in father    Hearing impairment    Noise exposure   History of diverticulitis of colon 03/2014   F/u CT showed resolution.   Hyperlipemia, mixed 03/2016   He declined statin 03/2016.  Mod high trigs 04/2020, recommended fenofibrate->he declined and these improved with TLC. Then I recommended statin again 07/2020 and 04/2022 and he declined   Iron deficiency anemia 03/2016; 03/2017   Hemoccults x 3 neg 05/2016.  Pt never took iron.  03/2017 iron still very low, mild anemia--hemoccults x 3 pos and I recommended pt start ferrous sulfate and arrange f/u with his GI MD.  Noncompliant with iron--saw GI MD 04/2017 and EGD showed esophagitis (o/w bx neg) likely causing his IDA.  Needs PPI bid x 74mo, then qd lifetime, plus oral iron replacement. iron still low 04/2019 b/c noncomplianc   Prostatitis    Undescended left testicle    suspected to be atrophic    Past Surgical History:  Procedure Laterality Date   COLONOSCOPY  age 21, then again 09/2014    Recall 09/2017 per [facility] records   ESOPHAGOGASTRODUODENOSCOPY  04/2017   Dr. Gonzella Lex, likely responsible for his IDA. (otherwise, bx was NEG).   INGUINAL HERNIA REPAIR     1 as infant, one in 1970s, 1 in 1980s   PROSTATE BIOPSY  ?2004   benign per pt report    Outpatient Medications Prior to Visit  Medication Sig Dispense Refill   Ferrous Sulfate Dried 200 (65 Fe) MG TABS Take by mouth daily.     Multiple Vitamin (MULTIVITAMIN) tablet Take 1 tablet by mouth daily.     No facility-administered medications prior to visit.    Allergies  Allergen Reactions   Sulfa Antibiotics Other (See Comments)    "sick"    Review of Systems As per HPI  PE:    10/05/2022   10:54 AM 08/12/2022    2:31 PM 04/28/2022    9:47 AM  Vitals with BMI  Height   5' 9.5"  Weight 200 lbs 6 oz 199 lbs 199 lbs  BMI   28.98  Systolic 142  118  Diastolic 88  71  Pulse 52  58     Physical Exam  Gen: Alert, well appearing.  Patient is oriented to person, place, time, and situation. AFFECT: pleasant, lucid thought and speech. Neuro: CN 2-12 intact bilaterally, no nystagmus . strength 5/5 in proximal and distal  upper extremities and lower extremities bilaterally.  No sensory deficits.  No tremor.  No disdiadochokinesis.  No ataxia.  Upper extremity and lower extremity DTRs symmetric.  No pronator drift.   LABS:  Last CBC Lab Results  Component Value Date   WBC 5.7 04/28/2022   HGB 14.5 04/28/2022   HCT 42.2 04/28/2022   MCV 92.3 04/28/2022   RDW 13.4 04/28/2022   PLT 266.0 04/28/2022   Last metabolic panel Lab Results  Component Value Date   GLUCOSE 91 04/28/2022   NA 141 04/28/2022   K 5.0 04/28/2022   CL 104 04/28/2022   CO2 30 04/28/2022   BUN 23 04/28/2022   CREATININE 0.99 04/28/2022   CALCIUM 9.4 04/28/2022   PROT 6.1 04/28/2022   ALBUMIN 4.3 04/28/2022   BILITOT 0.5 04/28/2022   ALKPHOS 45 04/28/2022   AST 17 04/28/2022   ALT 11 04/28/2022   IMPRESSION AND  PLAN:  Positional vertigo. Reassured.   Reviewed Epley maneuvers, gave handout. Signs/symptoms to call or return for were reviewed and pt expressed understanding.  An After Visit Summary was printed and given to the patient.  FOLLOW UP: Return if symptoms worsen or fail to improve.  Signed:  Santiago Bumpers, MD           10/05/2022

## 2022-10-20 DIAGNOSIS — K080 Exfoliation of teeth due to systemic causes: Secondary | ICD-10-CM | POA: Diagnosis not present

## 2023-03-11 ENCOUNTER — Ambulatory Visit (INDEPENDENT_AMBULATORY_CARE_PROVIDER_SITE_OTHER): Payer: Medicare Other | Admitting: Dermatology

## 2023-03-11 DIAGNOSIS — Z1283 Encounter for screening for malignant neoplasm of skin: Secondary | ICD-10-CM

## 2023-03-11 DIAGNOSIS — L814 Other melanin hyperpigmentation: Secondary | ICD-10-CM

## 2023-03-11 DIAGNOSIS — D229 Melanocytic nevi, unspecified: Secondary | ICD-10-CM

## 2023-03-11 DIAGNOSIS — D492 Neoplasm of unspecified behavior of bone, soft tissue, and skin: Secondary | ICD-10-CM | POA: Insufficient documentation

## 2023-03-11 DIAGNOSIS — D489 Neoplasm of uncertain behavior, unspecified: Secondary | ICD-10-CM

## 2023-03-11 DIAGNOSIS — D1801 Hemangioma of skin and subcutaneous tissue: Secondary | ICD-10-CM

## 2023-03-11 DIAGNOSIS — W908XXA Exposure to other nonionizing radiation, initial encounter: Secondary | ICD-10-CM

## 2023-03-11 DIAGNOSIS — L821 Other seborrheic keratosis: Secondary | ICD-10-CM

## 2023-03-11 DIAGNOSIS — Z872 Personal history of diseases of the skin and subcutaneous tissue: Secondary | ICD-10-CM

## 2023-03-11 DIAGNOSIS — L578 Other skin changes due to chronic exposure to nonionizing radiation: Secondary | ICD-10-CM

## 2023-03-11 DIAGNOSIS — D4819 Other specified neoplasm of uncertain behavior of connective and other soft tissue: Secondary | ICD-10-CM

## 2023-03-11 DIAGNOSIS — C4490 Unspecified malignant neoplasm of skin, unspecified: Secondary | ICD-10-CM

## 2023-03-11 DIAGNOSIS — Z85828 Personal history of other malignant neoplasm of skin: Secondary | ICD-10-CM

## 2023-03-11 HISTORY — DX: Unspecified malignant neoplasm of skin, unspecified: C44.90

## 2023-03-11 NOTE — Progress Notes (Signed)
Follow-Up Visit   Subjective  Matthew [name] is a 77 y.o. male who presents for the following: Skin Cancer Screening and Full Body Skin Exam hx of aks, hx of bcc,  Rough spot at near right corner of nose   The patient presents for Total-Body Skin Exam (TBSE) for skin cancer screening and mole check. The patient has spots, moles and lesions to be evaluated, some may be new or changing and the patient may have concern these could be cancer.    The following portions of the chart were reviewed this encounter and updated as appropriate: medications, allergies, medical history  Review of Systems:  No other skin or systemic complaints except as noted in HPI or Assessment and Plan.  Objective  Well appearing patient in no apparent distress; mood and affect are within normal limits.  A full examination was performed including scalp, head, eyes, ears, nose, lips, neck, chest, axillae, abdomen, back, buttocks, bilateral upper extremities, bilateral lower extremities, hands, feet, fingers, toes, fingernails, and toenails. All findings within normal limits unless otherwise noted below.   Relevant physical exam findings are noted in the Assessment and Plan.  right medial cheek 0.6 cm crusted papule with 2.0 cm surrounding irregular texture skin           Assessment & Plan   SKIN CANCER SCREENING PERFORMED TODAY.  ACTINIC DAMAGE - Chronic condition, secondary to cumulative UV/sun exposure - diffuse scaly erythematous macules with underlying dyspigmentation - Recommend daily broad spectrum sunscreen SPF 30+ to sun-exposed areas, reapply every 2 hours as needed.  - Staying in the shade or wearing long sleeves, sun glasses (UVA+UVB protection) and wide brim hats (4-inch brim around the entire circumference of the hat) are also recommended for sun protection.  - Call for new or changing lesions.  LENTIGINES, SEBORRHEIC KERATOSES, HEMANGIOMAS - Benign normal skin lesions -  Benign-appearing - Call for any changes  MELANOCYTIC NEVI - Tan-brown and/or pink-flesh-colored symmetric macules and papules - Benign appearing on exam today - Observation - Call clinic for new or changing moles - Recommend daily use of broad spectrum spf 30+ sunscreen to sun-exposed areas.    History of Basal Cell Carcinoma of the Skin Right temple 2014 - No evidence of recurrence today - Recommend regular full body skin exams - Recommend daily broad spectrum sunscreen SPF 30+ to sun-exposed areas, reapply every 2 hours as needed.  - Call if any new or changing lesions are noted between office visits     Neoplasm of uncertain behavior right medial cheek  Skin / nail biopsy Type of biopsy: tangential   Informed consent: discussed and consent obtained   Timeout: patient name, date of birth, surgical site, and procedure verified   Procedure prep:  Patient was prepped and draped in usual sterile fashion Prep type:  Isopropyl alcohol Anesthesia: the lesion was anesthetized in a standard fashion   Anesthetic:  1% lidocaine w/ epinephrine 1-100,000 buffered w/ 8.4% NaHCO3 Instrument used: flexible razor blade   Hemostasis achieved with: pressure, aluminum chloride and electrodesiccation   Outcome: patient tolerated procedure well   Post-procedure details: sterile dressing applied and wound care instructions given   Dressing type: petrolatum and bandage    Specimen 1 - Surgical pathology Differential Diagnosis: r/o bcc  Check Margins: No  Photo   R/o bcc     Return in about 1 year (around 03/10/2024) for TBSE.  IAsher Muir, CMA, am acting as scribe for Armida Sans, MD.   Documentation:  I have reviewed the above documentation for accuracy and completeness, and I agree with the above.  Armida Sans, MD

## 2023-03-11 NOTE — Patient Instructions (Addendum)

## 2023-03-16 ENCOUNTER — Telehealth: Payer: Self-pay

## 2023-03-16 ENCOUNTER — Encounter: Payer: Self-pay | Admitting: Dermatology

## 2023-03-16 LAB — SURGICAL PATHOLOGY

## 2023-03-16 NOTE — Telephone Encounter (Signed)
Lft pt msg to call for bx results

## 2023-03-16 NOTE — Telephone Encounter (Signed)
-----   Message from Armida Sans sent at 03/16/2023  5:39 PM EDT ----- FINAL DIAGNOSIS        1. Skin, right medial cheek :       SEBACEOUS NEOPLASM WITH ATYPICAL FEATURES, DEEP MARGIN INVOLVED, SEE DESCRIPTION   Atypical Sebaceous Neoplasm Recommend MOHS Contact Mohs clinic to see if they will schedule for this

## 2023-03-17 ENCOUNTER — Telehealth: Payer: Self-pay

## 2023-03-17 NOTE — Telephone Encounter (Signed)
Patholgy results emailed to Dr Adriana Simas to make sure he will treat,   right medial cheek :       SEBACEOUS NEOPLASM WITH ATYPICAL FEATURES, Waiting on Dr Shiela Mayer response

## 2023-03-17 NOTE — Telephone Encounter (Signed)
-----   Message from Armida Sans sent at 03/16/2023  5:39 PM EDT ----- FINAL DIAGNOSIS        1. Skin, right medial cheek :       SEBACEOUS NEOPLASM WITH ATYPICAL FEATURES, DEEP MARGIN INVOLVED, SEE DESCRIPTION   Atypical Sebaceous Neoplasm Recommend MOHS Contact Mohs clinic to see if they will schedule for this

## 2023-03-17 NOTE — Telephone Encounter (Signed)
Discussed pathology results with pt, mohs recommended, we emailed his report to Dr Adriana Simas in [facility] to see if this is something he can take care of, we will call patient once we get a response to let him know where we will be sending him for Mohs.

## 2023-04-28 ENCOUNTER — Telehealth: Payer: Self-pay

## 2023-04-28 NOTE — Telephone Encounter (Signed)
I spoke with Clydie Braun at Dr. Patsey Berthold office and she said pt is scheduled for Mohs on 07/23/23 at 7:30 am for sebaceous neoplasm with atypical features on the R med cheek.

## 2023-05-02 HISTORY — PX: OTHER SURGICAL HISTORY: SHX169

## 2023-05-04 NOTE — Progress Notes (Unsigned)
Office Note 05/05/2023  CC:  Chief Complaint  Patient presents with   Annual Exam    Pt is fasting.     HPI:  Patient is a 77 y.o. male who is here for annual health maintenance exam. Feels well. Is active, still working full-time.  Has some muscle cramps in the legs at night occasionally but otherwise no concerns. He takes an iron tab daily. He denies melena or hematochezia.  He does not take NSAIDs.  Past Medical History:  Diagnosis Date   Actinic keratosis    Allergic rhinitis    Basal cell carcinoma 12/08/2012   R temple - excision 01/18/13   BPPV (benign paroxysmal positional vertigo) 09/2022   BRBPR (bright red blood per rectum)    Int hemorrhoids   Family history of colon cancer in father    Hearing impairment    Noise exposure   History of diverticulitis of colon 03/2014   F/u CT showed resolution.   Hyperlipemia, mixed 03/2016   He declined statin 03/2016.  Mod high trigs 04/2020, recommended fenofibrate->he declined and these improved with TLC. Then I recommended statin again 07/2020 and 04/2022 and he declined   Iron deficiency anemia 03/2016; 03/2017   Hemoccults x 3 neg 05/2016.  Pt never took iron.  03/2017 iron still very low, mild anemia--hemoccults x 3 pos and I recommended pt start ferrous sulfate and arrange f/u with his GI MD.  Noncompliant with iron--saw GI MD 04/2017 and EGD showed esophagitis (o/w bx neg) likely causing his IDA.  Needs PPI bid x 34mo, then qd lifetime, plus oral iron replacement. iron still low 04/2019 b/c noncomplianc   Prostatitis    Sebaceous neoplasia of skin determined by biopsy 03/11/2023   SEBACEOUS NEOPLASM WITH ATYPICAL FEATURES, R med cheek, needs mohs   Undescended left testicle    suspected to be atrophic    Past Surgical History:  Procedure Laterality Date   COLONOSCOPY  age 62, then again 09/2014   Recall 09/2017 per [place] records   ESOPHAGOGASTRODUODENOSCOPY  04/2017   Dr. Gonzella Lex, likely responsible  for his IDA. (otherwise, bx was NEG).   INGUINAL HERNIA REPAIR     1 as infant, one in 1970s, 1 in 1980s   PROSTATE BIOPSY  ?2004   benign per pt report    Family History  Problem Relation Age of Onset   Cancer Father        colon and lung    Social History   Socioeconomic History   Marital status: Married    Spouse name: Not on file   Number of children: Not on file   Years of education: Not on file   Highest education level: Not on file  Occupational History   Not on file  Tobacco Use   Smoking status: Former   Smokeless tobacco: Former    Types: Chew  Substance and Sexual Activity   Alcohol use: Yes   Drug use: No   Sexual activity: Not on file  Other Topics Concern   Not on file  Social History Narrative   Married, 1 son.   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   No tobacco, rare alcohol, no drugs.   Social Determinants of Health   Financial Resource Strain: Low Risk  (08/12/2022)   Overall Financial Resource Strain (CARDIA)    Difficulty of Paying Living Expenses: Not hard at all  Food Insecurity: No Food Insecurity (08/12/2022)   Hunger Vital Sign  Worried About Programme researcher, broadcasting/film/video in the Last Year: Never true    Ran Out of Food in the Last Year: Never true  Transportation Needs: No Transportation Needs (08/12/2022)   PRAPARE - Administrator, Civil Service (Medical): No    Lack of Transportation (Non-Medical): No  Physical Activity: Sufficiently Active (08/12/2022)   Exercise Vital Sign    Days of Exercise per Week: 6 days    Minutes of Exercise per Session: 60 min  Stress: No Stress Concern Present (08/12/2022)   [employer] of Occupational Health - Occupational Stress Questionnaire    Feeling of Stress : Not at all  Social Connections: Socially Integrated (08/12/2022)   Social Connection and Isolation Panel [NHANES]    Frequency of Communication with Friends and Family: More than three times a week    Frequency of  Social Gatherings with Friends and Family: More than three times a week    Attends Religious Services: More than 4 times per year    Active Member of [employer] or Organizations: Yes    Attends Banker Meetings: 1 to 4 times per year    Marital Status: Married  Catering manager Violence: Not At Risk (08/12/2022)   Humiliation, Afraid, Rape, and Kick questionnaire    Fear of Current or Ex-Partner: No    Emotionally Abused: No    Physically Abused: No    Sexually Abused: No    Outpatient Medications Prior to Visit  Medication Sig Dispense Refill   Ferrous Sulfate Dried 200 (65 Fe) MG TABS Take by mouth daily.     Multiple Vitamin (MULTIVITAMIN) tablet Take 1 tablet by mouth daily.     No facility-administered medications prior to visit.    Allergies  Allergen Reactions   Sulfa Antibiotics Other (See Comments)    "sick"    Review of Systems  Constitutional:  Negative for appetite change, chills, fatigue and fever.  HENT:  Negative for congestion, dental problem, ear pain and sore throat.   Eyes:  Negative for discharge, redness and visual disturbance.  Respiratory:  Negative for cough, chest tightness, shortness of breath and wheezing.   Cardiovascular:  Negative for chest pain, palpitations and leg swelling.  Gastrointestinal:  Negative for abdominal pain, blood in stool, diarrhea, nausea and vomiting.  Genitourinary:  Negative for difficulty urinating, dysuria, flank pain, frequency, hematuria and urgency.  Musculoskeletal:  Negative for arthralgias, back pain, joint swelling, myalgias and neck stiffness.  Skin:  Negative for pallor and rash.  Neurological:  Negative for dizziness, speech difficulty, weakness and headaches.  Hematological:  Negative for adenopathy. Does not bruise/bleed easily.  Psychiatric/Behavioral:  Negative for confusion and sleep disturbance. The patient is not nervous/anxious.     PE;    05/05/2023    8:00 AM 10/05/2022   10:54 AM 08/12/2022     2:31 PM  Vitals with BMI  Height 5\' 9"     Weight 205 lbs 13 oz 200 lbs 6 oz 199 lbs  BMI 30.38    Systolic 156 142   Diastolic 71 88   Pulse 56 52    Gen: Alert, well appearing.  Patient is oriented to person, place, time, and situation. AFFECT: pleasant, lucid thought and speech. ENT: Ears: EACs clear, normal epithelium.  TMs with good light reflex and landmarks bilaterally.  Eyes: no injection, icteris, swelling, or exudate.  EOMI, PERRLA. Nose: no drainage or turbinate edema/swelling.  No injection or focal lesion.  Mouth: lips without lesion/swelling.  Oral  mucosa pink and moist.  Dentition intact and without obvious caries or gingival swelling.  Oropharynx without erythema, exudate, or swelling.  Neck: supple/nontender.  No LAD, mass, or TM.  Carotid pulses 2+ bilaterally, without bruits. CV: Irreg rhythm, distant S1 and S2, no m/r/g.   LUNGS: CTA bilat, nonlabored resps, good aeration in all lung fields. ABD: soft, NT, ND, BS normal.  No hepatospenomegaly or mass.  No bruits. EXT: no clubbing, cyanosis, or edema.  Musculoskeletal: no joint swelling, erythema, warmth, or tenderness.  ROM of all joints intact. Skin - no sores or suspicious lesions or rashes or color changes  Pertinent labs:  No results found for: "TSH" Lab Results  Component Value Date   WBC 5.7 04/28/2022   HGB 14.5 04/28/2022   HCT 42.2 04/28/2022   MCV 92.3 04/28/2022   PLT 266.0 04/28/2022   Lab Results  Component Value Date   IRON 118 04/28/2022   TIBC 372 04/28/2022   FERRITIN 34 04/28/2022    Lab Results  Component Value Date   CREATININE 0.99 04/28/2022   BUN 23 04/28/2022   NA 141 04/28/2022   K 5.0 04/28/2022   CL 104 04/28/2022   CO2 30 04/28/2022   Lab Results  Component Value Date   ALT 11 04/28/2022   AST 17 04/28/2022   ALKPHOS 45 04/28/2022   BILITOT 0.5 04/28/2022   Lab Results  Component Value Date   CHOL 245 (H) 04/28/2022   Lab Results  Component Value Date   HDL  47.40 04/28/2022   Lab Results  Component Value Date   LDLCALC 126 (H) 07/22/2020   Lab Results  Component Value Date   TRIG 323.0 (H) 04/28/2022   Lab Results  Component Value Date   CHOLHDL 5 04/28/2022   Lab Results  Component Value Date   PSA 1.73 04/28/2022   PSA 2.58 04/18/2021   PSA 1.65 04/17/2020   Twelve-lead EKG today: Sinus rhythm, rate 70, frequent PACs, no ST or T wave changes.  No hypertrophy.  Low voltage aVL, III, and aVF.  First-degree heart block.  QTc normal. No prior for comparison.  ASSESSMENT AND PLAN:   #1 health maintenance exam: Reviewed age and gender appropriate health maintenance issues (prudent diet, regular exercise, health risks of tobacco and excessive alcohol, use of seatbelts, fire alarms in home, use of sunscreen).  Also reviewed age and gender appropriate health screening as well as vaccine recommendations. Vaccines: Flu->declined.  Shingrix->declined. Labs: cbc,cmet, lipid, psa. Prostate ca screening: benefits vs risks of ongoing PSA testing at this point were reviewed---> patient elected o get this test today.- Colon ca screening:  Has hx of adenomatous polyps, [facility] 2019->gave pt their contact info at his cpe each of the last 2 years so he could contact them to arrange rpt--> patient has not done this yet but says he will try to make time to do this either with [facility] or with a GI he is familiar with in [place] [facility]).  #2 irregular cardiac rhythm.  Asymptomatic. Frequent PACs on EKG today. Will arrange 7d Zio monitoring.  #3 elevated blood pressure without diagnosis of hypertension. He monitors blood pressure occasionally at home and says it is usually less than 130/80. I will have him restart regular monitoring of this and we will regroup in 2 weeks.  #4 history of IDA. He is on iron daily. CBC today.  An After Visit Summary was printed and given to the patient.  FOLLOW UP:  Return in  about 4 weeks (around 06/02/2023)  for f/u bp and heart rhythm.  Signed:  Santiago Bumpers, MD           05/05/2023

## 2023-05-05 ENCOUNTER — Ambulatory Visit (INDEPENDENT_AMBULATORY_CARE_PROVIDER_SITE_OTHER): Payer: Medicare Other | Admitting: Family Medicine

## 2023-05-05 ENCOUNTER — Encounter: Payer: Self-pay | Admitting: Family Medicine

## 2023-05-05 ENCOUNTER — Ambulatory Visit: Payer: Medicare Other | Attending: Family Medicine

## 2023-05-05 VITALS — BP 156/71 | HR 56 | Ht 69.0 in | Wt 205.8 lb

## 2023-05-05 DIAGNOSIS — Z125 Encounter for screening for malignant neoplasm of prostate: Secondary | ICD-10-CM | POA: Diagnosis not present

## 2023-05-05 DIAGNOSIS — E7800 Pure hypercholesterolemia, unspecified: Secondary | ICD-10-CM

## 2023-05-05 DIAGNOSIS — I499 Cardiac arrhythmia, unspecified: Secondary | ICD-10-CM

## 2023-05-05 DIAGNOSIS — R030 Elevated blood-pressure reading, without diagnosis of hypertension: Secondary | ICD-10-CM | POA: Diagnosis not present

## 2023-05-05 DIAGNOSIS — Z8639 Personal history of other endocrine, nutritional and metabolic disease: Secondary | ICD-10-CM | POA: Diagnosis not present

## 2023-05-05 DIAGNOSIS — Z0000 Encounter for general adult medical examination without abnormal findings: Secondary | ICD-10-CM | POA: Diagnosis not present

## 2023-05-05 LAB — CBC
HCT: 42.5 % (ref 39.0–52.0)
Hemoglobin: 14.3 g/dL (ref 13.0–17.0)
MCHC: 33.7 g/dL (ref 30.0–36.0)
MCV: 96.5 fL (ref 78.0–100.0)
Platelets: 222 10*3/uL (ref 150.0–400.0)
RBC: 4.4 Mil/uL (ref 4.22–5.81)
RDW: 13.9 % (ref 11.5–15.5)
WBC: 6.2 10*3/uL (ref 4.0–10.5)

## 2023-05-05 LAB — COMPREHENSIVE METABOLIC PANEL
ALT: 12 U/L (ref 0–53)
AST: 18 U/L (ref 0–37)
Albumin: 4.2 g/dL (ref 3.5–5.2)
Alkaline Phosphatase: 48 U/L (ref 39–117)
BUN: 25 mg/dL — ABNORMAL HIGH (ref 6–23)
CO2: 29 meq/L (ref 19–32)
Calcium: 9.4 mg/dL (ref 8.4–10.5)
Chloride: 107 meq/L (ref 96–112)
Creatinine, Ser: 1.05 mg/dL (ref 0.40–1.50)
GFR: 67.97 mL/min (ref >60.00)
Glucose, Bld: 101 mg/dL — ABNORMAL HIGH (ref 70–99)
Potassium: 4.5 meq/L (ref 3.5–5.1)
Sodium: 142 meq/L (ref 135–145)
Total Bilirubin: 0.5 mg/dL (ref 0.2–1.2)
Total Protein: 6.2 g/dL (ref 6.0–8.3)

## 2023-05-05 LAB — LIPID PANEL
Cholesterol: 222 mg/dL — ABNORMAL HIGH (ref 0–200)
HDL: 47.6 mg/dL (ref >39.00)
LDL Cholesterol: 132 mg/dL — ABNORMAL HIGH (ref 0–99)
NonHDL: 174.08
Total CHOL/HDL Ratio: 5
Triglycerides: 210 mg/dL — ABNORMAL HIGH (ref 0.0–149.0)
VLDL: 42 mg/dL — ABNORMAL HIGH (ref 0.0–40.0)

## 2023-05-05 LAB — TSH: TSH: 2.28 u[IU]/mL (ref 0.35–5.50)

## 2023-05-05 LAB — PSA, MEDICARE: PSA: 1.82 ng/mL (ref 0.10–4.00)

## 2023-05-05 NOTE — Progress Notes (Unsigned)
EP to read

## 2023-05-05 NOTE — Patient Instructions (Signed)
Health Maintenance, Male Adopting a healthy lifestyle and getting preventive care are important in promoting health and wellness. Ask your health care provider about: The right schedule for you to have regular tests and exams. Things you can do on your own to prevent diseases and keep yourself healthy. What should I know about diet, weight, and exercise? Eat a healthy diet  Eat a diet that includes plenty of vegetables, fruits, low-fat dairy products, and lean protein. Do not eat a lot of foods that are high in solid fats, added sugars, or sodium. Maintain a healthy weight Body mass index (BMI) is a measurement that can be used to identify possible weight problems. It estimates body fat based on height and weight. Your health care provider can help determine your BMI and help you achieve or maintain a healthy weight. Get regular exercise Get regular exercise. This is one of the most important things you can do for your health. Most adults should: Exercise for at least 150 minutes each week. The exercise should increase your heart rate and make you sweat (moderate-intensity exercise). Do strengthening exercises at least twice a week. This is in addition to the moderate-intensity exercise. Spend less time sitting. Even light physical activity can be beneficial. Watch cholesterol and blood lipids Have your blood tested for lipids and cholesterol at 78 years of age, then have this test every 5 years. You may need to have your cholesterol levels checked more often if: Your lipid or cholesterol levels are high. You are older than 78 years of age. You are at high risk for heart disease. What should I know about cancer screening? Many types of cancers can be detected early and may often be prevented. Depending on your health history and family history, you may need to have cancer screening at various ages. This may include screening for: Colorectal cancer. Prostate cancer. Skin cancer. Lung  cancer. What should I know about heart disease, diabetes, and high blood pressure? Blood pressure and heart disease High blood pressure causes heart disease and increases the risk of stroke. This is more likely to develop in people who have high blood pressure readings or are overweight. Talk with your health care provider about your target blood pressure readings. Have your blood pressure checked: Every 3-5 years if you are 18-39 years of age. Every year if you are 40 years old or older. If you are between the ages of 65 and 75 and are a current or former smoker, ask your health care provider if you should have a one-time screening for abdominal aortic aneurysm (AAA). Diabetes Have regular diabetes screenings. This checks your fasting blood sugar level. Have the screening done: Once every three years after age 45 if you are at a normal weight and have a low risk for diabetes. More often and at a younger age if you are overweight or have a high risk for diabetes. What should I know about preventing infection? Hepatitis B If you have a higher risk for hepatitis B, you should be screened for this virus. Talk with your health care provider to find out if you are at risk for hepatitis B infection. Hepatitis C Blood testing is recommended for: Everyone born from 1945 through 1965. Anyone with known risk factors for hepatitis C. Sexually transmitted infections (STIs) You should be screened each year for STIs, including gonorrhea and chlamydia, if: You are sexually active and are younger than 78 years of age. You are older than 78 years of age and your   health care provider tells you that you are at risk for this type of infection. Your sexual activity has changed since you were last screened, and you are at increased risk for chlamydia or gonorrhea. Ask your health care provider if you are at risk. Ask your health care provider about whether you are at high risk for HIV. Your health care provider  may recommend a prescription medicine to help prevent HIV infection. If you choose to take medicine to prevent HIV, you should first get tested for HIV. You should then be tested every 3 months for as long as you are taking the medicine. Follow these instructions at home: Alcohol use Do not drink alcohol if your health care provider tells you not to drink. If you drink alcohol: Limit how much you have to 0-2 drinks a day. Know how much alcohol is in your drink. In the [place] one drink equals one 12 oz bottle of beer (355 mL), one 5 oz glass of wine (148 mL), or one 1 oz glass of hard liquor (44 mL). Lifestyle Do not use any products that contain nicotine or tobacco. These products include cigarettes, chewing tobacco, and vaping devices, such as e-cigarettes. If you need help quitting, ask your health care provider. Do not use street drugs. Do not share needles. Ask your health care provider for help if you need support or information about quitting drugs. General instructions Schedule regular health, dental, and eye exams. Stay current with your vaccines. Tell your health care provider if: You often feel depressed. You have ever been abused or do not feel safe at home. Summary Adopting a healthy lifestyle and getting preventive care are important in promoting health and wellness. Follow your health care provider's instructions about healthy diet, exercising, and getting tested or screened for diseases. Follow your health care provider's instructions on monitoring your cholesterol and blood pressure. This information is not intended to replace advice given to you by your health care provider. Make sure you discuss any questions you have with your health care provider. Document Revised: 10/07/2020 Document Reviewed: 10/07/2020 Elsevier Patient Education  2024 Elsevier Inc.  

## 2023-05-10 DIAGNOSIS — K080 Exfoliation of teeth due to systemic causes: Secondary | ICD-10-CM | POA: Diagnosis not present

## 2023-05-10 DIAGNOSIS — I499 Cardiac arrhythmia, unspecified: Secondary | ICD-10-CM | POA: Diagnosis not present

## 2023-05-21 DIAGNOSIS — I499 Cardiac arrhythmia, unspecified: Secondary | ICD-10-CM | POA: Diagnosis not present

## 2023-05-24 ENCOUNTER — Encounter: Payer: Self-pay | Admitting: Family Medicine

## 2023-06-03 NOTE — Progress Notes (Signed)
 OFFICE VISIT  06/04/2023  CC:  Chief Complaint  Patient presents with   Heart Problem    F/U.     Patient is a 78 y.o. male who presents for 1 mo f/u irregular heart rhythm. A/P as of last visit: #1 irregular cardiac rhythm.  Asymptomatic. Frequent PACs on EKG today. Will arrange 7d Zio monitoring.   #2 elevated blood pressure without diagnosis of hypertension. He monitors blood pressure occasionally at home and says it is usually less than 130/80. I will have him restart regular monitoring of this and we will regroup in 2 weeks.   #3 history of IDA. He is on iron daily. CBC today.  INTERIM HX: He wore monitor 7 days: HR 45-167, average 70 bpm. 24 SVT episodes, longest 11.6 seconds. No atrial fibrillation detected. Frequent supraventricular ectopy, 13.9%. Rare ventricular ectopy. No sustained arrhythmias. No symptom trigger episodes.  He continues to deny any palpitations, sense of racing heart, dizziness, chest pain, or shortness of breath. He says he feels well other than having a cold for the last couple of weeks.  He says he is about 98% over this cold though.  He wore his monitor before the onset of the cold.  Past Medical History:  Diagnosis Date   Actinic keratosis    Allergic rhinitis    Basal cell carcinoma 12/08/2012   R temple - excision 01/18/13   BPPV (benign paroxysmal positional vertigo) 09/2022   BRBPR (bright red blood per rectum)    Int hemorrhoids   Family history of colon cancer in father    Hearing impairment    Noise exposure   History of diverticulitis of colon 03/2014   F/u CT showed resolution.   Hyperlipemia, mixed 03/2016   He declined statin 03/2016.  Mod high trigs 04/2020, recommended fenofibrate ->he declined and these improved with TLC. Then I recommended statin again 07/2020 and 04/2022 and he declined   Iron deficiency anemia 03/2016; 03/2017   Hemoccults x 3 neg 05/2016.  Pt never took iron.  03/2017 iron still very low, mild  anemia--hemoccults x 3 pos and I recommended pt start ferrous sulfate and arrange f/u with his GI MD.  Noncompliant with iron--saw GI MD 04/2017 and EGD showed esophagitis (o/w bx neg) likely causing his IDA.  Needs PPI bid x 17mo, then qd lifetime, plus oral iron replacement. iron still low 04/2019 b/c noncomplianc   Prostatitis    PSVT (paroxysmal supraventricular tachycardia) (HCC)    zio 05/2023   Sebaceous neoplasia of skin determined by biopsy 03/11/2023   SEBACEOUS NEOPLASM WITH ATYPICAL FEATURES, R med cheek, needs mohs   Undescended left testicle    suspected to be atrophic    Past Surgical History:  Procedure Laterality Date   COLONOSCOPY  age 78, then again 09/2014   Recall 09/2017 per [facility] records   ESOPHAGOGASTRODUODENOSCOPY  04/2017   Dr. Ivie, likely responsible for his IDA. (otherwise, bx was NEG).   INGUINAL HERNIA REPAIR     1 as infant, one in 1970s, 1 in 1980s   PROSTATE BIOPSY  ?2004   benign per pt report   Zio monitor x 7d  05/2023   HR 50-160, avg 70, some brief SVT runs, frequent supraventricular ectopy, rare ventric ectopy, no afib or other sustained arrhythmia    Outpatient Medications Prior to Visit  Medication Sig Dispense Refill   Ferrous Sulfate Dried 200 (65 Fe) MG TABS Take by mouth daily.     Multiple Vitamin (MULTIVITAMIN) tablet Take 1  tablet by mouth daily.     No facility-administered medications prior to visit.    Allergies  Allergen Reactions   Sulfa  Antibiotics Other (See Comments)    sick    Review of Systems As per HPI  PE:    06/04/2023    8:50 AM 05/05/2023    8:00 AM 10/05/2022   10:54 AM  Vitals with BMI  Height  5' 9   Weight 202 lbs 10 oz 205 lbs 13 oz 200 lbs 6 oz  BMI  30.38   Systolic 130 156 857  Diastolic 83 71 88  Pulse 60 56 52     Physical Exam  Gen: Alert, well appearing.  Patient is oriented to person, place, time, and situation. AFFECT: pleasant, lucid thought and speech. CV: Regularly  irregular, rate about 60, no murmur/rub/gallop LUNGS: CTA bilat   LABS:  Last CBC Lab Results  Component Value Date   WBC 6.2 05/05/2023   HGB 14.3 05/05/2023   HCT 42.5 05/05/2023   MCV 96.5 05/05/2023   RDW 13.9 05/05/2023   PLT 222.0 05/05/2023   Lab Results  Component Value Date   IRON 118 04/28/2022   TIBC 372 04/28/2022   FERRITIN 34 04/28/2022   Last metabolic panel Lab Results  Component Value Date   GLUCOSE 101 (H) 05/05/2023   NA 142 05/05/2023   K 4.5 05/05/2023   CL 107 05/05/2023   CO2 29 05/05/2023   BUN 25 (H) 05/05/2023   CREATININE 1.05 05/05/2023   GFR 67.97 05/05/2023   CALCIUM 9.4 05/05/2023   PROT 6.2 05/05/2023   ALBUMIN 4.2 05/05/2023   BILITOT 0.5 05/05/2023   ALKPHOS 48 05/05/2023   AST 18 05/05/2023   ALT 12 05/05/2023   Last thyroid  functions Lab Results  Component Value Date   TSH 2.28 05/05/2023   IMPRESSION AND PLAN:  #1 irregular cardiac rhythm. ZIO monitoring showed significant supraventricular ectopy but no significantly sustained or symptomatic runs of SVT. Reassured. No treatment or further evaluation at this time.  An After Visit Summary was printed and given to the patient.  FOLLOW UP: No follow-ups on file.  Signed:  Gerlene Hockey, MD           06/04/2023

## 2023-06-04 ENCOUNTER — Ambulatory Visit (INDEPENDENT_AMBULATORY_CARE_PROVIDER_SITE_OTHER): Payer: Medicare Other | Admitting: Family Medicine

## 2023-06-04 ENCOUNTER — Encounter: Payer: Self-pay | Admitting: Family Medicine

## 2023-06-04 VITALS — BP 130/83 | HR 60 | Wt 202.6 lb

## 2023-06-04 DIAGNOSIS — I499 Cardiac arrhythmia, unspecified: Secondary | ICD-10-CM

## 2023-06-04 DIAGNOSIS — I471 Supraventricular tachycardia, unspecified: Secondary | ICD-10-CM

## 2023-06-15 DIAGNOSIS — K080 Exfoliation of teeth due to systemic causes: Secondary | ICD-10-CM | POA: Diagnosis not present

## 2023-07-06 ENCOUNTER — Ambulatory Visit: Payer: Medicare Other | Admitting: Dermatology

## 2023-07-06 DIAGNOSIS — D4819 Other specified neoplasm of uncertain behavior of connective and other soft tissue: Secondary | ICD-10-CM

## 2023-07-06 DIAGNOSIS — D492 Neoplasm of unspecified behavior of bone, soft tissue, and skin: Secondary | ICD-10-CM

## 2023-07-06 DIAGNOSIS — L905 Scar conditions and fibrosis of skin: Secondary | ICD-10-CM | POA: Diagnosis not present

## 2023-07-06 NOTE — Patient Instructions (Signed)

## 2023-07-06 NOTE — Progress Notes (Signed)
   Follow-Up Visit   Subjective  Matthew [name] is a 78 y.o. male who presents for the following: rechecking biopsy site from 03/11/23, per patient site is looking good has had no issues. Pt scheduled for [facility] surgery 07/23/23 with Dr. 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   The patient has spots, moles and lesions to be evaluated, some may be new or changing and the patient may have concern these could be cancer.   The following portions of the chart were reviewed this encounter and updated as appropriate: medications, allergies, medical history  Review of Systems:  No other skin or systemic complaints except as noted in HPI or Assessment and Plan.  Objective  Well appearing patient in no apparent distress; mood and affect are within normal limits.   A focused examination was performed of the following areas: Face  Relevant exam findings are noted in the Assessment and Plan.    Assessment & Plan   Atypical Sebaceous Neoplasm  R Medial Cheek Exam: Scar with slight elevation at inferior edge  Treatment Plan: Keep appointment with Dr. Bluford at [facility] for Mohs Surgery Patient does not have hx of any other cancers. Patient's father with hx of colon cancer. Patient advised that having Atypical Sebaceous Neoplasm can put patient at risk for a form of Lynch syndrome (Muir-Torre).  Recommend patient let PCP know that he has been diagnosed with Atypical Sebaceous Neoplasm and recommend he continue regular cancer screening exams, and consider genetic counseling if any positive findings. Pt states he is past due for his colonoscopy and states he will get that scheduled.      Return for As scheduled, w/ Dr. Hester, [facility].  I, Jacquelynn Vera, CMA, am acting as scribe for Rexene Rattler, MD .   Documentation: I have reviewed the above documentation for accuracy and completeness, and I agree with the above.  Rexene Rattler, MD

## 2023-08-16 DIAGNOSIS — C801 Malignant (primary) neoplasm, unspecified: Secondary | ICD-10-CM

## 2023-08-16 DIAGNOSIS — D485 Neoplasm of uncertain behavior of skin: Secondary | ICD-10-CM | POA: Diagnosis not present

## 2023-08-16 HISTORY — DX: Malignant (primary) neoplasm, unspecified: C80.1

## 2023-08-30 ENCOUNTER — Telehealth: Payer: Self-pay

## 2023-08-30 NOTE — Telephone Encounter (Signed)
 Updated specimen tracking and history from [facility] progress notes of R medal Cheek/Sebaceous neoplasm with atypical feature. Aw

## 2023-09-08 ENCOUNTER — Ambulatory Visit (INDEPENDENT_AMBULATORY_CARE_PROVIDER_SITE_OTHER): Admitting: *Deleted

## 2023-09-08 DIAGNOSIS — Z0000 Encounter for general adult medical examination without abnormal findings: Secondary | ICD-10-CM

## 2023-09-08 NOTE — Patient Instructions (Signed)
 [name] , Thank you for taking time to come for your Medicare Wellness Visit. I appreciate your ongoing commitment to your health goals. Please review the following plan we discussed and let me know if I can assist you in the future.   Screening recommendations/referrals: Colonoscopy: no longer required Recommended yearly ophthalmology/optometry visit for glaucoma screening and checkup Recommended yearly dental visit for hygiene and checkup  Vaccinations: Influenza vaccine: up to date Pneumococcal vaccine: up to date Tdap vaccine: up to date Shingles vaccine: Education provided    Advanced directives: Education provided     Preventive Care 65 Years and Older, Male Preventive care refers to lifestyle choices and visits with your health care provider that can promote health and wellness. What does preventive care include? A yearly physical exam. This is also called an annual well check. Dental exams once or twice a year. Routine eye exams. Ask your health care provider how often you should have your eyes checked. Personal lifestyle choices, including: Daily care of your teeth and gums. Regular physical activity. Eating a healthy diet. Avoiding tobacco and drug use. Limiting alcohol use. Practicing safe sex. Taking low doses of aspirin every day. Taking vitamin and mineral supplements as recommended by your health care provider. What happens during an annual well check? The services and screenings done by your health care provider during your annual well check will depend on your age, overall health, lifestyle risk factors, and family history of disease. Counseling  Your health care provider may ask you questions about your: Alcohol use. Tobacco use. Drug use. Emotional well-being. Home and relationship well-being. Sexual activity. Eating habits. History of falls. Memory and ability to understand (cognition). Work and work Astronomer. Screening  You may have the  following tests or measurements: Height, weight, and BMI. Blood pressure. Lipid and cholesterol levels. These may be checked every 5 years, or more frequently if you are over 49 years old. Skin check. Lung cancer screening. You may have this screening every year starting at age 10 if you have a 30-pack-year history of smoking and currently smoke or have quit within the past 15 years. Fecal occult blood test (FOBT) of the stool. You may have this test every year starting at age 71. Flexible sigmoidoscopy or colonoscopy. You may have a sigmoidoscopy every 5 years or a colonoscopy every 10 years starting at age 10. Prostate cancer screening. Recommendations will vary depending on your family history and other risks. Hepatitis C blood test. Hepatitis B blood test. Sexually transmitted disease (STD) testing. Diabetes screening. This is done by checking your blood sugar (glucose) after you have not eaten for a while (fasting). You may have this done every 1-3 years. Abdominal aortic aneurysm (AAA) screening. You may need this if you are a current or former smoker. Osteoporosis. You may be screened starting at age 13 if you are at high risk. Talk with your health care provider about your test results, treatment options, and if necessary, the need for more tests. Vaccines  Your health care provider may recommend certain vaccines, such as: Influenza vaccine. This is recommended every year. Tetanus, diphtheria, and acellular pertussis (Tdap, Td) vaccine. You may need a Td booster every 10 years. Zoster vaccine. You may need this after age 43. Pneumococcal 13-valent conjugate (PCV13) vaccine. One dose is recommended after age 21. Pneumococcal polysaccharide (PPSV23) vaccine. One dose is recommended after age 15. Talk to your health care provider about which screenings and vaccines you need and how often you need them.  This information is not intended to replace advice given to you by your health care  provider. Make sure you discuss any questions you have with your health care provider. Document Released: 06/14/2015 Document Revised: 02/05/2016 Document Reviewed: 03/19/2015 Elsevier Interactive Patient Education  2017 [employer].  Fall Prevention in the Home Falls can cause injuries. They can happen to people of all ages. There are many things you can do to make your home safe and to help prevent falls. What can I do on the outside of my home? Regularly fix the edges of walkways and driveways and fix any cracks. Remove anything that might make you trip as you walk through a door, such as a raised step or threshold. Trim any bushes or trees on the path to your home. Use bright outdoor lighting. Clear any walking paths of anything that might make someone trip, such as rocks or tools. Regularly check to see if handrails are loose or broken. Make sure that both sides of any steps have handrails. Any raised decks and porches should have guardrails on the edges. Have any leaves, snow, or ice cleared regularly. Use sand or salt on walking paths during winter. Clean up any spills in your garage right away. This includes oil or grease spills. What can I do in the bathroom? Use night lights. Install grab bars by the toilet and in the tub and shower. Do not use towel bars as grab bars. Use non-skid mats or decals in the tub or shower. If you need to sit down in the shower, use a plastic, non-slip stool. Keep the floor dry. Clean up any water that spills on the floor as soon as it happens. Remove soap buildup in the tub or shower regularly. Attach bath mats securely with double-sided non-slip rug tape. Do not have throw rugs and other things on the floor that can make you trip. What can I do in the bedroom? Use night lights. Make sure that you have a light by your bed that is easy to reach. Do not use any sheets or blankets that are too big for your bed. They should not hang down onto the  floor. Have a firm chair that has side arms. You can use this for support while you get dressed. Do not have throw rugs and other things on the floor that can make you trip. What can I do in the kitchen? Clean up any spills right away. Avoid walking on wet floors. Keep items that you use a lot in easy-to-reach places. If you need to reach something above you, use a strong step stool that has a grab bar. Keep electrical cords out of the way. Do not use floor polish or wax that makes floors slippery. If you must use wax, use non-skid floor wax. Do not have throw rugs and other things on the floor that can make you trip. What can I do with my stairs? Do not leave any items on the stairs. Make sure that there are handrails on both sides of the stairs and use them. Fix handrails that are broken or loose. Make sure that handrails are as long as the stairways. Check any carpeting to make sure that it is firmly attached to the stairs. Fix any carpet that is loose or worn. Avoid having throw rugs at the top or bottom of the stairs. If you do have throw rugs, attach them to the floor with carpet tape. Make sure that you have a light switch at the  top of the stairs and the bottom of the stairs. If you do not have them, ask someone to add them for you. What else can I do to help prevent falls? Wear shoes that: Do not have high heels. Have rubber bottoms. Are comfortable and fit you well. Are closed at the toe. Do not wear sandals. If you use a stepladder: Make sure that it is fully opened. Do not climb a closed stepladder. Make sure that both sides of the stepladder are locked into place. Ask someone to hold it for you, if possible. Clearly mark and make sure that you can see: Any grab bars or handrails. First and last steps. Where the edge of each step is. Use tools that help you move around (mobility aids) if they are needed. These include: Canes. Walkers. Scooters. Crutches. Turn on the  lights when you go into a dark area. Replace any light bulbs as soon as they burn out. Set up your furniture so you have a clear path. Avoid moving your furniture around. If any of your floors are uneven, fix them. If there are any pets around you, be aware of where they are. Review your medicines with your doctor. Some medicines can make you feel dizzy. This can increase your chance of falling. Ask your doctor what other things that you can do to help prevent falls. This information is not intended to replace advice given to you by your health care provider. Make sure you discuss any questions you have with your health care provider. Document Released: 03/14/2009 Document Revised: 10/24/2015 Document Reviewed: 06/22/2014 Elsevier Interactive Patient Education  2017 [employer].

## 2023-09-08 NOTE — Progress Notes (Signed)
 Subjective:   Matthew [name] is a 78 y.o. male who presents for Medicare Annual/Subsequent preventive examination.  Visit Complete: Virtual I connected with  Arlice Colt on 09/08/23 by a audio enabled telemedicine application and verified that I am speaking with the correct person using two identifiers.  Patient Location: Home  Provider Location: Home Office  I discussed the limitations of evaluation and management by telemedicine. The patient expressed understanding and agreed to proceed.  Vital Signs: Because this visit was a virtual/telehealth visit, some criteria may be missing or patient reported. Any vitals not documented were not able to be obtained and vitals that have been documented are patient reported. .  Cardiac Risk Factors include: advanced age (>17men, >24 women);male gender     Objective:    There were no vitals filed for this visit. There is no height or weight on file to calculate BMI.     09/08/2023   10:53 AM 08/12/2022    2:35 PM 08/09/2021    9:40 AM  Advanced Directives  Does Patient Have a Medical Advance Directive? No No No;Yes  Type of Advance Directive   Living will  Would patient like information on creating a medical advance directive? No - Patient declined No - Patient declined     Current Medications (verified) Outpatient Encounter Medications as of 09/08/2023  Medication Sig   Ferrous Sulfate Dried 200 (65 Fe) MG TABS Take by mouth daily.   Multiple Vitamin (MULTIVITAMIN) tablet Take 1 tablet by mouth daily.   No facility-administered encounter medications on file as of 09/08/2023.    Allergies (verified) Sulfa antibiotics   History: Past Medical History:  Diagnosis Date   Actinic keratosis    Allergic rhinitis    Basal cell carcinoma 12/08/2012   R temple - excision 01/18/13   BPPV (benign paroxysmal positional vertigo) 09/2022   BRBPR (bright red blood per rectum)    Int hemorrhoids   Family history of colon cancer in father     Hearing impairment    Noise exposure   History of diverticulitis of colon 03/2014   F/u CT showed resolution.   Hyperlipemia, mixed 03/2016   He declined statin 03/2016.  Mod high trigs 04/2020, recommended fenofibrate->he declined and these improved with TLC. Then I recommended statin again 07/2020 and 04/2022 and he declined   Iron deficiency anemia 03/2016; 03/2017   Hemoccults x 3 neg 05/2016.  Pt never took iron.  03/2017 iron still very low, mild anemia--hemoccults x 3 pos and I recommended pt start ferrous sulfate and arrange f/u with his GI MD.  Noncompliant with iron--saw GI MD 04/2017 and EGD showed esophagitis (o/w bx neg) likely causing his IDA.  Needs PPI bid x 29mo, then qd lifetime, plus oral iron replacement. iron still low 04/2019 b/c noncomplianc   Prostatitis    PSVT (paroxysmal supraventricular tachycardia) (HCC)    zio 05/2023   Sebaceous neoplasia of skin determined by biopsy 03/11/2023   SEBACEOUS NEOPLASM WITH ATYPICAL FEATURES, R med cheek, MOHs 08/16/23   Undescended left testicle    suspected to be atrophic   Past Surgical History:  Procedure Laterality Date   COLONOSCOPY  age 78, then again 09/2014   Recall 09/2017 per [place] records   ESOPHAGOGASTRODUODENOSCOPY  04/2017   Dr. Gonzella Lex, likely responsible for his IDA. (otherwise, bx was NEG).   INGUINAL HERNIA REPAIR     1 as infant, one in 1970s, 1 in 1980s   PROSTATE BIOPSY  ?2004  benign per pt report   Zio monitor x 7d  05/2023   HR 50-160, avg 70, some brief SVT runs, frequent supraventricular ectopy, rare ventric ectopy, no afib or other sustained arrhythmia   Family History  Problem Relation Age of Onset   Cancer Father        colon and lung   Social History   Socioeconomic History   Marital status: Married    Spouse name: Not on file   Number of children: Not on file   Years of education: Not on file   Highest education level: Some college, no degree  Occupational History   Not on  file  Tobacco Use   Smoking status: Former   Smokeless tobacco: Former    Types: Chew  Substance and Sexual Activity   Alcohol use: Yes   Drug use: No   Sexual activity: Not Currently  Other Topics Concern   Not on file  Social History Narrative   Married, 1 son.   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   No tobacco, rare alcohol, no drugs.   Social Drivers of Corporate investment banker Strain: Low Risk  (09/08/2023)   Overall Financial Resource Strain (CARDIA)    Difficulty of Paying Living Expenses: Not hard at all  Food Insecurity: No Food Insecurity (09/08/2023)   Hunger Vital Sign    Worried About Running Out of Food in the Last Year: Never true    Ran Out of Food in the Last Year: Never true  Transportation Needs: No Transportation Needs (09/08/2023)   PRAPARE - Administrator, Civil Service (Medical): No    Lack of Transportation (Non-Medical): No  Physical Activity: Insufficiently Active (09/08/2023)   Exercise Vital Sign    Days of Exercise per Week: 3 days    Minutes of Exercise per Session: 40 min  Stress: No Stress Concern Present (09/08/2023)   [employer] of Occupational Health - Occupational Stress Questionnaire    Feeling of Stress : Not at all  Social Connections: Moderately Integrated (09/08/2023)   Social Connection and Isolation Panel [NHANES]    Frequency of Communication with Friends and Family: More than three times a week    Frequency of Social Gatherings with Friends and Family: Three times a week    Attends Religious Services: More than 4 times per year    Active Member of Clubs or Organizations: No    Attends Banker Meetings: Never    Marital Status: Married    Tobacco Counseling Counseling given: Not Answered   Clinical Intake:  Pre-visit preparation completed: Yes  Pain : No/denies pain     Diabetes: No  How often do you need to have someone help you when you read instructions, pamphlets,  or other written materials from your doctor or pharmacy?: 1 - Never  Interpreter Needed?: No  Information entered by :: Remi Haggard LPN   Activities of Daily Living    09/08/2023   10:54 AM  In your present state of health, do you have any difficulty performing the following activities:  Hearing? 1  Vision? 0  Difficulty concentrating or making decisions? 0  Walking or climbing stairs? 0  Dressing or bathing? 0  Doing errands, shopping? 0  Preparing Food and eating ? N  Using the Toilet? N  In the past six months, have you accidently leaked urine? N  Do you have problems with loss of bowel control? N  Managing your  Medications? N  Managing your Finances? N  Housekeeping or managing your Housekeeping? N    Patient Care Team: Jeoffrey Massed, MD as PCP - General (Family Medicine) Clint Bolder, MD as Consulting Physician (Gastroenterology) Deirdre Evener, MD as Consulting Physician (Dermatology) Arlice Colt, MD as Consulting Physician (Gastroenterology)  Indicate any recent Medical Services you may have received from other than [facility] providers in the past year (date may be approximate).     Assessment:   This is a routine wellness examination for Matthew [name].  Hearing/Vision screen Hearing Screening - Comments:: Bilateral hearing aids Vision Screening - Comments:: Not up to date [facility]   Goals Addressed             This Visit's Progress    Patient Stated       Stay  healthy       Depression Screen    09/08/2023   10:56 AM 08/12/2022    2:34 PM 04/28/2022    9:49 AM 08/09/2021    9:36 AM 04/18/2021    8:01 AM 04/17/2020    2:30 PM 04/05/2019    7:59 AM  PHQ 2/9 Scores  PHQ - 2 Score 0 0 0 0 0 0 0  PHQ- 9 Score 0          Fall Risk    06/03/2023   10:31 AM 08/12/2022    2:36 PM 06/05/2022   10:41 AM 04/28/2022    9:50 AM 08/09/2021    9:40 AM  Fall Risk   Falls in the past year? 0 0 0 0 0  Number falls in past yr:  0 0 0 0  Injury with Fall?  0 0 0 0   Risk for fall due to :  Impaired vision  No Fall Risks History of fall(s)  Follow up  Falls prevention discussed  Falls evaluation completed Falls evaluation completed    MEDICARE RISK AT HOME: Medicare Risk at Home Any stairs in or around the home?: Yes If so, are there any without handrails?: No Home free of loose throw rugs in walkways, pet beds, electrical cords, etc?: Yes Adequate lighting in your home to reduce risk of falls?: Yes Life alert?: No Use of a cane, walker or w/c?: No Grab bars in the bathroom?: No Shower chair or bench in shower?: Yes Elevated toilet seat or a handicapped toilet?: No  TIMED UP AND GO:  Was the test performed?  No    Cognitive Function:        09/08/2023   10:52 AM 08/12/2022    2:36 PM 08/09/2021    9:41 AM  6CIT Screen  What Year? 0 points 0 points 0 points  What month? 0 points 0 points 0 points  What time? 0 points 0 points 0 points  Count back from 20 0 points 0 points 0 points  Months in reverse 0 points 0 points 0 points  Repeat phrase 0 points 0 points 0 points  Total Score 0 points 0 points 0 points    Immunizations Immunization History  Administered Date(s) Administered   Fluad Quad(high Dose 65+) 04/05/2019   Influenza, High Dose Seasonal PF 03/26/2015, 03/18/2016, 03/30/2017, 03/31/2018   Influenza,inj,Quad PF,6+ Mos 03/29/2014   Moderna Sars-Covid-2 Vaccination 10/26/2019, 11/23/2019   Pneumococcal Conjugate-13 03/18/2016   Pneumococcal Polysaccharide-23 03/30/2017   Tdap 09/10/2021    TDAP status: Up to date  Flu Vaccine status: Up to date  Pneumococcal vaccine status: Up to date  Covid-19 vaccine status:  Information provided on how to obtain vaccines.   Qualifies for Shingles Vaccine? Yes   Zostavax completed No   Shingrix Completed?: No.    Education has been provided regarding the importance of this vaccine. Patient has been advised to call insurance company to determine out of pocket expense if they have  not yet received this vaccine. Advised may also receive vaccine at local pharmacy or Health Dept. Verbalized acceptance and understanding.  Screening Tests Health Maintenance  Topic Date Due   Zoster Vaccines- Shingrix (1 of 2) Never done   COVID-19 Vaccine (3 - Moderna risk series) 12/21/2019   INFLUENZA VACCINE  12/31/2023   Medicare Annual Wellness (AWV)  09/07/2024   DTaP/Tdap/Td (2 - Td or Tdap) 09/11/2031   Pneumonia Vaccine 80+ Years old  Completed   Hepatitis C Screening  Completed   HPV VACCINES  Aged Out    Health Maintenance  Health Maintenance Due  Topic Date Due   Zoster Vaccines- Shingrix (1 of 2) Never done   COVID-19 Vaccine (3 - Moderna risk series) 12/21/2019    Colorectal cancer screening: No longer required.   Lung Cancer Screening: (Low Dose CT Chest recommended if Age 42-80 years, 20 pack-year currently smoking OR have quit w/in 15years.) does not qualify.   Lung Cancer Screening Referral:   Additional Screening:  Hepatitis C Screening: does not qualify; Completed 2020  Vision Screening: Recommended annual ophthalmology exams for early detection of glaucoma and other disorders of the eye. Is the patient up to date with their annual eye exam?  No  Who is the provider or what is the name of the office in which the patient attends annual eye exams? [facility] If pt is not established with a provider, would they like to be referred to a provider to establish care? No .   Dental Screening: Recommended annual dental exams for proper oral hygiene    Community Resource Referral / Chronic Care Management: CRR required this visit?  No   CCM required this visit?  No     Plan:     I have personally reviewed and noted the following in the patient's chart:   Medical and social history Use of alcohol, tobacco or illicit drugs  Current medications and supplements including opioid prescriptions. Patient is not currently taking opioid prescriptions. Functional  ability and status Nutritional status Physical activity Advanced directives List of other physicians Hospitalizations, surgeries, and ER visits in previous 12 months Vitals Screenings to include cognitive, depression, and falls Referrals and appointments  In addition, I have reviewed and discussed with patient certain preventive protocols, quality metrics, and best practice recommendations. A written personalized care plan for preventive services as well as general preventive health recommendations were provided to patient.     Remi Haggard, LPN   07/11/5619   After Visit Summary: (MyChart) Due to this being a telephonic visit, the after visit summary with patients personalized plan was offered to patient via MyChart   Nurse Notes:

## 2023-11-11 DIAGNOSIS — K080 Exfoliation of teeth due to systemic causes: Secondary | ICD-10-CM | POA: Diagnosis not present

## 2023-11-17 DIAGNOSIS — K080 Exfoliation of teeth due to systemic causes: Secondary | ICD-10-CM | POA: Diagnosis not present

## 2024-03-15 ENCOUNTER — Encounter: Payer: Self-pay | Admitting: Dermatology

## 2024-03-15 ENCOUNTER — Ambulatory Visit (INDEPENDENT_AMBULATORY_CARE_PROVIDER_SITE_OTHER): Payer: Medicare Other | Admitting: Dermatology

## 2024-03-15 DIAGNOSIS — L570 Actinic keratosis: Secondary | ICD-10-CM | POA: Diagnosis not present

## 2024-03-15 DIAGNOSIS — D692 Other nonthrombocytopenic purpura: Secondary | ICD-10-CM

## 2024-03-15 DIAGNOSIS — L814 Other melanin hyperpigmentation: Secondary | ICD-10-CM | POA: Diagnosis not present

## 2024-03-15 DIAGNOSIS — L821 Other seborrheic keratosis: Secondary | ICD-10-CM

## 2024-03-15 DIAGNOSIS — L578 Other skin changes due to chronic exposure to nonionizing radiation: Secondary | ICD-10-CM | POA: Diagnosis not present

## 2024-03-15 DIAGNOSIS — Z7189 Other specified counseling: Secondary | ICD-10-CM

## 2024-03-15 DIAGNOSIS — L820 Inflamed seborrheic keratosis: Secondary | ICD-10-CM

## 2024-03-15 DIAGNOSIS — W908XXA Exposure to other nonionizing radiation, initial encounter: Secondary | ICD-10-CM

## 2024-03-15 DIAGNOSIS — Z1283 Encounter for screening for malignant neoplasm of skin: Secondary | ICD-10-CM | POA: Diagnosis not present

## 2024-03-15 DIAGNOSIS — D229 Melanocytic nevi, unspecified: Secondary | ICD-10-CM

## 2024-03-15 DIAGNOSIS — Z85828 Personal history of other malignant neoplasm of skin: Secondary | ICD-10-CM

## 2024-03-15 NOTE — Progress Notes (Signed)
 Follow-Up Visit   Subjective  Matthew [name] is a 78 y.o. male who presents for the following: Skin Cancer Screening and Full Body Skin Exam  The patient presents for Total-Body Skin Exam (TBSE) for skin cancer screening and mole check. The patient has spots, moles and lesions to be evaluated, some may be new or changing and the patient may have concern these could be cancer.  The following portions of the chart were reviewed this encounter and updated as appropriate: medications, allergies, medical history  Review of Systems:  No other skin or systemic complaints except as noted in HPI or Assessment and Plan.  Objective  Well appearing patient in no apparent distress; mood and affect are within normal limits.  A full examination was performed including scalp, head, eyes, ears, nose, lips, neck, chest, axillae, abdomen, back, buttocks, bilateral upper extremities, bilateral lower extremities, hands, feet, fingers, toes, fingernails, and toenails. All findings within normal limits unless otherwise noted below.   Relevant physical exam findings are noted in the Assessment and Plan.  R ant neck x 2 (2) Erythematous stuck-on, waxy papule or plaque Left Ear x 1, R ear x 1 (2) Erythematous thin papules/macules with gritty scale.   Assessment & Plan   SKIN CANCER SCREENING PERFORMED TODAY.  ACTINIC DAMAGE - Chronic condition, secondary to cumulative UV/sun exposure - diffuse scaly erythematous macules with underlying dyspigmentation - Recommend daily broad spectrum sunscreen SPF 30+ to sun-exposed areas, reapply every 2 hours as needed.  - Staying in the shade or wearing long sleeves, sun glasses (UVA+UVB protection) and wide brim hats (4-inch brim around the entire circumference of the hat) are also recommended for sun protection.  - Call for new or changing lesions.  LENTIGINES, SEBORRHEIC KERATOSES, HEMANGIOMAS - Benign normal skin lesions - Benign-appearing - Call for any  changes  MELANOCYTIC NEVI - Tan-brown and/or pink-flesh-colored symmetric macules and papules - Benign appearing on exam today - Observation - Call clinic for new or changing moles - Recommend daily use of broad spectrum spf 30+ sunscreen to sun-exposed areas.   HISTORY OF BASAL CELL CARCINOMA OF THE SKIN - R temple, excision 01/18/2013 - No evidence of recurrence today - Recommend regular full body skin exams - Recommend daily broad spectrum sunscreen SPF 30+ to sun-exposed areas, reapply every 2 hours as needed.  - Call if any new or changing lesions are noted between office visits   INFLAMED SEBORRHEIC KERATOSIS (2) R ant neck x 2 (2) Symptomatic, irritating, patient would like treated.  Destruction of lesion - R ant neck x 2 (2) Complexity: simple   Destruction method: cryotherapy   Informed consent: discussed and consent obtained   Timeout:  patient name, date of birth, surgical site, and procedure verified Lesion destroyed using liquid nitrogen: Yes   Region frozen until ice ball extended beyond lesion: Yes   Outcome: patient tolerated procedure well with no complications   Post-procedure details: wound care instructions given    AK (ACTINIC KERATOSIS) (2) Left Ear x 1, R ear x 1 (2) Actinic keratoses are precancerous spots that appear secondary to cumulative UV radiation exposure/sun exposure over time. They are chronic with expected duration over 1 year. A portion of actinic keratoses will progress to squamous cell carcinoma of the skin. It is not possible to reliably predict which spots will progress to skin cancer and so treatment is recommended to prevent development of skin cancer.  Recommend daily broad spectrum sunscreen SPF 30+ to sun-exposed areas, reapply every  2 hours as needed.  Recommend staying in the shade or wearing long sleeves, sun glasses (UVA+UVB protection) and wide brim hats (4-inch brim around the entire circumference of the hat). Call for new or  changing lesions.  Destruction of lesion - Left Ear x 1, R ear x 1 (2) Complexity: simple   Destruction method: cryotherapy   Informed consent: discussed and consent obtained   Timeout:  patient name, date of birth, surgical site, and procedure verified Lesion destroyed using liquid nitrogen: Yes   Region frozen until ice ball extended beyond lesion: Yes   Outcome: patient tolerated procedure well with no complications   Post-procedure details: wound care instructions given    SKIN CANCER SCREENING   ACTINIC SKIN DAMAGE   LENTIGO   MELANOCYTIC NEVUS, UNSPECIFIED LOCATION   HISTORY OF BASAL CELL CARCINOMA   PURPURA    Purpura - Chronic; persistent and recurrent.  Treatable, but not curable. - Violaceous macules and patches - Benign - Related to trauma, age, sun damage and/or use of blood thinners, chronic use of topical and/or oral steroids - Observe - Can use OTC arnica containing moisturizer such as Dermend Bruise Formula if desired - Call for worsening or other concerns  HISTORY OF SKIN CANCER - atypical sebaceous neoplasm, tx with Mohs 08/16/2023, R cheek - Clear. Observe for recurrence.  - Call clinic for new or changing lesions.   - Recommend regular skin exams, daily broad-spectrum spf 30+ sunscreen use, and photoprotection.    Reviewed Muir-Torre Syndrome with pt.  Risk factors: Younger than 60 years at first diagnosis of sebaceous neoplasm (1 point)  No=0 Presence of one or more sebaceous neoplasms (0 points for one; 2 points if 2 or more) No=0 Personal history of Lynch-related malignancy (1 point) No=0 Family history of Lynch-related malignancy (1 point) Yes=1.  Pt father with Colon Cancer.  Pt scores 1 for above which does not likely put him at risk for Muir Toree Syndrome. He has not had colonoscopy for several years.  Advised to discuss with his PCP if he should have colonoscopy.  He will.  With Dr Helon?   Return in about 1 year (around 03/15/2025)  for TBSE - hx BCC.  Matthew [name], CMA, am acting as scribe for Alm Rhyme, MD .   Documentation: I have reviewed the above documentation for accuracy and completeness, and I agree with the above.  Alm Rhyme, MD

## 2024-03-15 NOTE — Patient Instructions (Signed)

## 2024-04-21 DIAGNOSIS — H52223 Regular astigmatism, bilateral: Secondary | ICD-10-CM | POA: Diagnosis not present

## 2024-05-09 ENCOUNTER — Encounter: Payer: Self-pay | Admitting: Family Medicine

## 2024-05-09 ENCOUNTER — Ambulatory Visit: Admitting: Family Medicine

## 2024-05-09 ENCOUNTER — Telehealth: Payer: Self-pay

## 2024-05-09 ENCOUNTER — Ambulatory Visit: Payer: Self-pay

## 2024-05-09 VITALS — BP 120/70 | HR 99 | Temp 98.7°F | Wt 198.4 lb

## 2024-05-09 DIAGNOSIS — R051 Acute cough: Secondary | ICD-10-CM | POA: Diagnosis not present

## 2024-05-09 DIAGNOSIS — R0989 Other specified symptoms and signs involving the circulatory and respiratory systems: Secondary | ICD-10-CM

## 2024-05-09 DIAGNOSIS — J988 Other specified respiratory disorders: Secondary | ICD-10-CM

## 2024-05-09 DIAGNOSIS — B9689 Other specified bacterial agents as the cause of diseases classified elsewhere: Secondary | ICD-10-CM | POA: Diagnosis not present

## 2024-05-09 MED ORDER — CEFDINIR 300 MG PO CAPS: 300.0000 mg | ORAL_CAPSULE | Freq: Two times a day (BID) | ORAL | 0 refills | Status: AC

## 2024-05-09 MED ORDER — ALBUTEROL SULFATE HFA 108 (90 BASE) MCG/ACT IN AERS: 2.0000 | INHALATION_SPRAY | Freq: Four times a day (QID) | RESPIRATORY_TRACT | 0 refills | Status: AC | PRN

## 2024-05-09 MED ORDER — METHYLPREDNISOLONE ACETATE 80 MG/ML IJ SUSP
80.0000 mg | Freq: Once | INTRAMUSCULAR | Status: AC
  Administered 2024-05-09: 80 mg via INTRAMUSCULAR

## 2024-05-09 MED ORDER — DM-GUAIFENESIN ER 30-600 MG PO TB12: 2.0000 | ORAL_TABLET | Freq: Two times a day (BID) | ORAL | 0 refills | Status: AC

## 2024-05-09 MED ORDER — IPRATROPIUM-ALBUTEROL 0.5-2.5 (3) MG/3ML IN SOLN
3.0000 mL | RESPIRATORY_TRACT | Status: AC
  Administered 2024-05-09: 3 mL via RESPIRATORY_TRACT

## 2024-05-09 NOTE — Patient Instructions (Addendum)
 Return in about 2 weeks (around 05/23/2024), or if symptoms worsen or fail to improve.  We will call you with the chest xray results. I have called in an antibiotic, inhaler and mucinex  DM (helps with cough and secretions) .      Great to see you today.  I have refilled the medication(s) we provide.   If labs were collected or images ordered, we will inform you of  results once we have received them and reviewed. We will contact you either by echart message, or telephone call.  Please give ample time to the testing facility, and our office to run,  receive and review results. Please do not call inquiring of results, even if you can see them in your chart. We will contact you as soon as we are able. If it has been over 1 week since the test was completed, and you have not yet heard from us , then please call us .    - echart message- for normal results that have been seen by the patient already.   - telephone call: abnormal results or if patient has not viewed results in their echart.  If a referral to a specialist was entered for you, please call us  in 2 weeks if you have not heard from the specialist office to schedule.

## 2024-05-09 NOTE — Telephone Encounter (Signed)
 Copied from CRM [id number]. Topic: Clinical - Request for Lab/Test Order >> May 09, 2024  4:14 PM Drema MATSU wrote: Reason for CRM: Patient stated that he went to [facility] does not do X-ray and it is down. Patient wants to know if there is another location he can go to in order to have the X-ray done.  Pt advised of alternative location, [facility] at [facility]. Pt agreed with going to this location. Information sent via MyChart.

## 2024-05-09 NOTE — Telephone Encounter (Signed)
 Duplicate

## 2024-05-09 NOTE — Progress Notes (Signed)
 Matthew [name] , [birth year], 78 y.o., male MRN: [id number] Patient Care Team    Relationship Specialty Notifications Start End  McGowen, Aleene DEL, MD PCP - General Family Medicine  03/09/14   Verner Chauncey RAMAN, MD Consulting Physician Gastroenterology  05/03/17   Hester Alm BROCKS, MD Consulting Physician Dermatology  01/06/18   Zulema Casino, MD Consulting Physician Gastroenterology  04/17/20    Comment: [facility]    Chief Complaint  Patient presents with   Cough    Onset since thanksgivning     Subjective: Matthew [name] is a 78 y.o. Pt presents for an OV with complaints of productive cough and shortness of breath of 12 days duration, started at Thanksgiving.  Associated symptoms include body aches.  Patient reports his symptoms are worsening. Patient monitors his oxygen saturations and reports he normally has a reading of 98% oxygen saturations at home and he has been reading 90-92%. Pt has tried rest, hydration and home remedies to ease their symptoms.   Former smoker.  No history of lung disease.     09/08/2023   10:56 AM 08/12/2022    2:34 PM 04/28/2022    9:49 AM 08/09/2021    9:36 AM 04/18/2021    8:01 AM  Depression screen PHQ 2/9  Decreased Interest 0 0 0 0 0  Down, Depressed, Hopeless 0 0 0 0 0  PHQ - 2 Score 0 0 0 0 0  Altered sleeping 0      Tired, decreased energy 0      Change in appetite 0      Feeling bad or failure about yourself  0      Trouble concentrating 0      Moving slowly or fidgety/restless 0      Suicidal thoughts 0      PHQ-9 Score 0       Difficult doing work/chores Not difficult at all         Data saved with a previous flowsheet row definition      08/09/2021    9:40 AM 04/28/2022    9:50 AM 06/05/2022   10:41 AM 08/12/2022    2:36 PM 06/03/2023   10:31 AM  Fall Risk  Falls in the past year? 0 0 0 0 0  Was there an injury with Fall? 0  0  0  0    Fall Risk Category Calculator 0 0 0 0   Fall Risk Category (Retired) Low  Low  Low      (RETIRED) Patient Fall Risk Level Low fall risk  Low fall risk      Patient at Risk for Falls Due to History of fall(s) No Fall Risks  Impaired vision   Fall risk Follow up Falls evaluation completed  Falls evaluation completed   Falls prevention discussed      Data saved with a previous flowsheet row definition    Allergies  Allergen Reactions   Sulfa  Antibiotics Other (See Comments)    sick   Social History   Social History Narrative   Married, 1 son.   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   No tobacco, rare alcohol, no drugs.   Past Medical History:  Diagnosis Date   Actinic keratosis    Allergic rhinitis    atypical sebaceous neoplasm of R cheek 08/16/2023   R cheek, hx with mohs   Basal cell carcinoma 12/08/2012   R temple - excision 01/18/13  BPPV (benign paroxysmal positional vertigo) 09/2022   BRBPR (bright red blood per rectum)    Int hemorrhoids   Family history of colon cancer in father    Hearing impairment    Noise exposure   History of diverticulitis of colon 03/2014   F/u CT showed resolution.   Hyperlipemia, mixed 03/2016   He declined statin 03/2016.  Mod high trigs 04/2020, recommended fenofibrate ->he declined and these improved with TLC. Then I recommended statin again 07/2020 and 04/2022 and he declined   Iron deficiency anemia 03/2016; 03/2017   Hemoccults x 3 neg 05/2016.  Pt never took iron.  03/2017 iron still very low, mild anemia--hemoccults x 3 pos and I recommended pt start ferrous sulfate and arrange f/u with his GI MD.  Noncompliant with iron--saw GI MD 04/2017 and EGD showed esophagitis (o/w bx neg) likely causing his IDA.  Needs PPI bid x 54mo, then qd lifetime, plus oral iron replacement. iron still low 04/2019 b/c noncomplianc   Prostatitis    PSVT (paroxysmal supraventricular tachycardia)    zio 05/2023   Sebaceous neoplasia of skin determined by biopsy 03/11/2023   SEBACEOUS NEOPLASM WITH ATYPICAL FEATURES, R med  cheek, MOHs 08/16/23   Undescended left testicle    suspected to be atrophic   Past Surgical History:  Procedure Laterality Date   COLONOSCOPY  age 64, then again 09/2014   Recall 09/2017 per [place] records   ESOPHAGOGASTRODUODENOSCOPY  04/2017   Dr. Ivie, likely responsible for his IDA. (otherwise, bx was NEG).   INGUINAL HERNIA REPAIR     1 as infant, one in 1970s, 1 in 1980s   PROSTATE BIOPSY  ?2004   benign per pt report   Zio monitor x 7d  05/2023   HR 50-160, avg 70, some brief SVT runs, frequent supraventricular ectopy, rare ventric ectopy, no afib or other sustained arrhythmia   Family History  Problem Relation Age of Onset   Cancer Father        colon and lung   Allergies as of 05/09/2024       Reactions   Sulfa  Antibiotics Other (See Comments)   sick        Medication List        Accurate as of May 09, 2024  3:20 PM. If you have any questions, ask your nurse or doctor.          STOP taking these medications    multivitamin tablet Stopped by: Charlies Bellini       TAKE these medications    albuterol  108 (90 Base) MCG/ACT inhaler Commonly known as: VENTOLIN  HFA Inhale 2 puffs into the lungs every 6 (six) hours as needed for wheezing or shortness of breath. Started by: Charlies Bellini   cefdinir  300 MG capsule Commonly known as: OMNICEF  Take 1 capsule (300 mg total) by mouth 2 (two) times daily. Started by: Xiong Haidar   dextromethorphan-guaiFENesin  30-600 MG 12hr tablet Commonly known as: MUCINEX  DM Take 2 tablets by mouth 2 (two) times daily. Started by: Charlies Bellini   Ferrous Sulfate Dried 200 (65 Fe) MG Tabs Take by mouth daily.        All past medical history, surgical history, allergies, family history, immunizations andmedications were updated in the EMR today and reviewed under the history and medication portions of their EMR.     Review of Systems  Constitutional:  Positive for malaise/fatigue. Negative for  chills and fever.  HENT:  Positive for congestion and sore throat.  Eyes: Negative.   Respiratory:  Positive for cough, sputum production and shortness of breath.   Cardiovascular: Negative.   Gastrointestinal:  Negative for abdominal pain, diarrhea, nausea and vomiting.  Genitourinary: Negative.   Musculoskeletal:  Positive for myalgias.  Skin:  Negative for rash.  Neurological:  Negative for dizziness and headaches.   Negative, with the exception of above mentioned in HPI   Objective:  BP 120/70   Pulse 99   Temp 98.7 F (37.1 C)   Wt 198 lb 6.4 oz (90 kg)   SpO2 95%   BMI 29.30 kg/m  Body mass index is 29.3 kg/m. Physical Exam Vitals and nursing note reviewed. Exam conducted with a chaperone present.  Constitutional:      General: He is not in acute distress.    Appearance: Normal appearance. He is not ill-appearing, toxic-appearing or diaphoretic.  HENT:     Head: Normocephalic and atraumatic.     Nose: Congestion and rhinorrhea present.     Right Turbinates: Swollen.     Left Turbinates: Swollen.     Right Sinus: Maxillary sinus tenderness and frontal sinus tenderness present.     Left Sinus: Maxillary sinus tenderness and frontal sinus tenderness present.     Mouth/Throat:     Lips: No lesions.     Mouth: Mucous membranes are moist.     Tongue: No lesions.     Pharynx: Postnasal drip present. No oropharyngeal exudate or posterior oropharyngeal erythema.  Eyes:     General: No scleral icterus.       Right eye: No discharge.        Left eye: No discharge.     Extraocular Movements: Extraocular movements intact.     Pupils: Pupils are equal, round, and reactive to light.  Cardiovascular:     Rate and Rhythm: Normal rate and regular rhythm.     Heart sounds: No murmur heard. Pulmonary:     Effort: Pulmonary effort is normal. No respiratory distress.     Breath sounds: Wheezing, rhonchi and rales present.  Skin:    General: Skin is warm and dry.      Coloration: Skin is not jaundiced or pale.     Findings: No rash.  Neurological:     Mental Status: He is alert and oriented to person, place, and time. Mental status is at baseline.  Psychiatric:        Mood and Affect: Mood normal.        Behavior: Behavior normal.        Thought Content: Thought content normal.        Judgment: Judgment normal.     No results found. No results found. No results found for this or any previous visit (from the past 24 hours).  Assessment/Plan: Matthew [name] is a 78 y.o. male present for OV for  Acute cough (Primary)/rales/bacterial resp infection Significant deep bronchial cough, wheezing and rales present- concerning for possible pneumonia development.  - DG Chest 2 View; Future - methylPREDNISolone  acetate (DEPO-MEDROL ) injection 80 mg - ipratropium-albuterol  (DUONEB) 0.5-2.5 (3) MG/3ML nebulizer solution 3 mL Rest, hydrate. > increased air movement after tx with less coughing. Rales still present.  mucinex  (DM if cough), nettie pot or nasal saline.  Omnicef  BID 10 days prescribed, take until completed. If CXR + for PNA will add azith Follow-up in 2 weeks symptoms are not resolved  Reviewed expectations re: course of current medical issues. Discussed self-management of symptoms. Outlined signs and symptoms indicating need  for more acute intervention. Patient verbalized understanding and all questions were answered. Patient received an After-Visit Summary.    Orders Placed This Encounter  Procedures   DG Chest 2 View   Meds ordered this encounter  Medications   cefdinir  (OMNICEF ) 300 MG capsule    Sig: Take 1 capsule (300 mg total) by mouth 2 (two) times daily.    Dispense:  20 capsule    Refill:  0   albuterol  (VENTOLIN  HFA) 108 (90 Base) MCG/ACT inhaler    Sig: Inhale 2 puffs into the lungs every 6 (six) hours as needed for wheezing or shortness of breath.    Dispense:  8 g    Refill:  0   methylPREDNISolone  acetate (DEPO-MEDROL )  injection 80 mg   ipratropium-albuterol  (DUONEB) 0.5-2.5 (3) MG/3ML nebulizer solution 3 mL   dextromethorphan-guaiFENesin  (MUCINEX  DM) 30-600 MG 12hr tablet    Sig: Take 2 tablets by mouth 2 (two) times daily.    Dispense:  60 tablet    Refill:  0   Referral Orders  No referral(s) requested today     Note is dictated utilizing voice recognition software. Although note has been proof read prior to signing, occasional typographical errors still can be missed. If any questions arise, please do not hesitate to call for verification.   electronically signed by:  Charlies Bellini, DO  [facility] [place]

## 2024-05-09 NOTE — Telephone Encounter (Signed)
 FYI Only or Action Required?: FYI only for provider: appointment scheduled on 12/9.  Patient was last seen in primary care on 06/04/2023 by McGowen, Aleene DEL, MD.  Called Nurse Triage reporting Shortness of Breath.  Symptoms began a week ago.  Interventions attempted: Rest, hydration, or home remedies.  Symptoms are: gradually worsening.  Triage Disposition: See HCP Within 4 Hours (Or PCP Triage)  Patient/caregiver understands and will follow disposition?: Yes  Copied from CRM #[id number]. Topic: Clinical - Red [name] Triage >> May 09, 2024 12:53 PM Alfonso HERO wrote: Matthew [name] triggered transfer to Nurse Triage. See Triage Message for details. >> May 09, 2024 12:54 PM Alfonso HERO wrote: Reason for Triage: patient having shortness of breathe due to upper respiratory congestion Reason for Disposition  [1] MILD difficulty breathing (e.g., minimal/no SOB at rest, SOB with walking, pulse < 100) AND [2] NEW-onset or WORSE than normal  Answer Assessment - Initial Assessment Questions Starting over thanksgiving weekend. Coughing up green phlegm, mild SOB when coughing, mild body aches (resolved). Decreased energy, trying to just let it pass.  Oxygen monitor reading 90-92%, typically reading 98%.   Denies fever, CP, dizziness. Appt with pcp office this afternoon to assess. ED/UC precautions advised.   1. RESPIRATORY STATUS: Describe your breathing? (e.g., wheezing, shortness of breath, unable to speak, severe coughing)      Mild SOB at rest- hard to get a deep breath  2. ONSET: When did this breathing problem begin?      Thanksgiving weekend 3. PATTERN Does the difficult breathing come and go, or has it been constant since it started?      Worse with activity  4. SEVERITY: How bad is your breathing? (e.g., mild, moderate, severe)      Mild-moderate  5. RECURRENT SYMPTOM: Have you had difficulty breathing before? If Yes, ask: When was the last time? and What happened that time?       seasonal 6. CARDIAC HISTORY: Do you have any history of heart disease? (e.g., heart attack, angina, bypass surgery, angioplasty)      neg 7. LUNG HISTORY: Do you have any history of lung disease?  (e.g., pulmonary embolus, asthma, emphysema)     neg 8. CAUSE: What do you think is causing the breathing problem?      URI 9. OTHER SYMPTOMS: Do you have any other symptoms? (e.g., chest pain, cough, dizziness, fever, runny nose)     Cough, nasal congestion  10. O2 SATURATION MONITOR:  Do you use an oxygen saturation monitor (pulse oximeter) at home? If Yes, ask: What is your reading (oxygen level) today? What is your usual oxygen saturation reading? (e.g., 95%)       Oxygen monitor reading 90-92%, typically reading 98%.  Protocols used: Breathing Difficulty-A-AH

## 2024-05-10 ENCOUNTER — Ambulatory Visit
Admission: RE | Admit: 2024-05-10 | Discharge: 2024-05-10 | Disposition: A | Discharge status: Home / Self Care | Source: Ambulatory Visit | Attending: Family Medicine | Admitting: Family Medicine

## 2024-05-10 ENCOUNTER — Ambulatory Visit: Payer: Self-pay | Admitting: Family Medicine

## 2024-05-10 ENCOUNTER — Ambulatory Visit
Admission: RE | Admit: 2024-05-10 | Discharge: 2024-05-10 | Disposition: A | Discharge status: Home / Self Care | Attending: Family Medicine | Admitting: Family Medicine

## 2024-05-10 DIAGNOSIS — R0989 Other specified symptoms and signs involving the circulatory and respiratory systems: Secondary | ICD-10-CM | POA: Diagnosis present

## 2024-05-10 DIAGNOSIS — B9689 Other specified bacterial agents as the cause of diseases classified elsewhere: Secondary | ICD-10-CM | POA: Diagnosis present

## 2024-05-10 DIAGNOSIS — J929 Pleural plaque without asbestos: Secondary | ICD-10-CM | POA: Diagnosis not present

## 2024-05-10 DIAGNOSIS — J988 Other specified respiratory disorders: Secondary | ICD-10-CM | POA: Diagnosis present

## 2024-05-10 DIAGNOSIS — R051 Acute cough: Secondary | ICD-10-CM

## 2024-05-10 DIAGNOSIS — I517 Cardiomegaly: Secondary | ICD-10-CM | POA: Diagnosis not present

## 2024-05-10 DIAGNOSIS — I700 Atherosclerosis of aorta: Secondary | ICD-10-CM | POA: Diagnosis not present

## 2024-05-10 DIAGNOSIS — R059 Cough, unspecified: Secondary | ICD-10-CM | POA: Diagnosis not present

## 2024-05-18 DIAGNOSIS — K080 Exfoliation of teeth due to systemic causes: Secondary | ICD-10-CM | POA: Diagnosis not present

## 2024-09-13 ENCOUNTER — Encounter

## 2025-03-22 ENCOUNTER — Ambulatory Visit: Admitting: Dermatology
